# Patient Record
Sex: Female | Born: 1935 | Hispanic: No | Smoking: Never smoker
Health system: Southern US, Community
[De-identification: ages and names within clinical notes are randomized; demographics above are authoritative.]

## PROBLEM LIST (undated history)

## (undated) DIAGNOSIS — K5790 Diverticulosis of intestine, part unspecified, without perforation or abscess without bleeding: Secondary | ICD-10-CM

## (undated) DIAGNOSIS — I509 Heart failure, unspecified: Secondary | ICD-10-CM

## (undated) DIAGNOSIS — I4891 Unspecified atrial fibrillation: Secondary | ICD-10-CM

## (undated) DIAGNOSIS — K5730 Diverticulosis of large intestine without perforation or abscess without bleeding: Secondary | ICD-10-CM

## (undated) DIAGNOSIS — K219 Gastro-esophageal reflux disease without esophagitis: Secondary | ICD-10-CM

## (undated) DIAGNOSIS — M1990 Unspecified osteoarthritis, unspecified site: Secondary | ICD-10-CM

## (undated) DIAGNOSIS — I10 Essential (primary) hypertension: Secondary | ICD-10-CM

## (undated) DIAGNOSIS — R011 Cardiac murmur, unspecified: Secondary | ICD-10-CM

## (undated) DIAGNOSIS — E785 Hyperlipidemia, unspecified: Secondary | ICD-10-CM

## (undated) DIAGNOSIS — R32 Unspecified urinary incontinence: Secondary | ICD-10-CM

## (undated) HISTORY — DX: Hyperlipidemia, unspecified: E78.5

## (undated) HISTORY — DX: Essential (primary) hypertension: I10

## (undated) HISTORY — DX: Unspecified urinary incontinence: R32

## (undated) HISTORY — DX: Diverticulosis of intestine, part unspecified, without perforation or abscess without bleeding: K57.90

## (undated) HISTORY — PX: JOINT REPLACEMENT: SHX530

## (undated) HISTORY — DX: Diverticulosis of large intestine without perforation or abscess without bleeding: K57.30

## (undated) HISTORY — PX: CHOLECYSTECTOMY: SHX55

## (undated) HISTORY — PX: LAMINECTOMY: SHX219

## (undated) HISTORY — DX: Gastro-esophageal reflux disease without esophagitis: K21.9

## (undated) HISTORY — DX: Heart failure, unspecified: I50.9

## (undated) HISTORY — DX: Unspecified osteoarthritis, unspecified site: M19.90

## (undated) HISTORY — PX: SPINAL CORD DECOMPRESSION: SHX97

## (undated) HISTORY — DX: Unspecified atrial fibrillation: I48.91

## (undated) HISTORY — DX: Cardiac murmur, unspecified: R01.1

---

## 2005-12-19 ENCOUNTER — Other Ambulatory Visit: Payer: Self-pay

## 2005-12-19 ENCOUNTER — Inpatient Hospital Stay: Payer: Self-pay | Admitting: Internal Medicine

## 2006-01-01 ENCOUNTER — Ambulatory Visit: Payer: Self-pay | Admitting: Internal Medicine

## 2006-02-10 ENCOUNTER — Observation Stay: Payer: Self-pay | Admitting: Infectious Diseases

## 2006-02-10 ENCOUNTER — Other Ambulatory Visit: Payer: Self-pay

## 2006-07-02 ENCOUNTER — Emergency Department: Payer: Self-pay | Admitting: Emergency Medicine

## 2006-07-02 ENCOUNTER — Other Ambulatory Visit: Payer: Self-pay

## 2006-09-11 ENCOUNTER — Inpatient Hospital Stay: Payer: Self-pay | Admitting: Infectious Diseases

## 2006-09-18 ENCOUNTER — Emergency Department: Payer: Self-pay | Admitting: Emergency Medicine

## 2006-09-18 ENCOUNTER — Other Ambulatory Visit: Payer: Self-pay

## 2007-02-21 ENCOUNTER — Observation Stay: Payer: Self-pay | Admitting: Specialist

## 2007-04-20 ENCOUNTER — Ambulatory Visit: Payer: Self-pay | Admitting: Urology

## 2007-05-02 ENCOUNTER — Ambulatory Visit: Payer: Self-pay | Admitting: Urology

## 2007-10-18 ENCOUNTER — Emergency Department: Payer: Self-pay | Admitting: Internal Medicine

## 2007-10-18 ENCOUNTER — Other Ambulatory Visit: Payer: Self-pay

## 2008-03-07 ENCOUNTER — Ambulatory Visit: Payer: Self-pay | Admitting: Urology

## 2008-03-07 ENCOUNTER — Other Ambulatory Visit: Payer: Self-pay

## 2008-03-12 ENCOUNTER — Ambulatory Visit: Payer: Self-pay | Admitting: Urology

## 2008-10-12 HISTORY — PX: SHOULDER HEMI-ARTHROPLASTY: SHX5049

## 2009-10-20 ENCOUNTER — Inpatient Hospital Stay: Payer: Self-pay | Admitting: Specialist

## 2009-10-25 ENCOUNTER — Encounter: Payer: Self-pay | Admitting: Internal Medicine

## 2009-11-12 ENCOUNTER — Encounter: Payer: Self-pay | Admitting: Internal Medicine

## 2009-11-13 ENCOUNTER — Inpatient Hospital Stay: Payer: Self-pay | Admitting: Internal Medicine

## 2009-12-10 ENCOUNTER — Encounter: Payer: Self-pay | Admitting: Internal Medicine

## 2010-01-10 ENCOUNTER — Encounter: Payer: Self-pay | Admitting: Internal Medicine

## 2010-12-11 ENCOUNTER — Emergency Department: Payer: Self-pay | Admitting: Emergency Medicine

## 2011-02-15 ENCOUNTER — Inpatient Hospital Stay: Payer: Self-pay | Admitting: Internal Medicine

## 2011-02-19 ENCOUNTER — Encounter: Payer: Self-pay | Admitting: Internal Medicine

## 2011-03-13 ENCOUNTER — Encounter: Payer: Self-pay | Admitting: Internal Medicine

## 2011-04-12 ENCOUNTER — Encounter: Payer: Self-pay | Admitting: Internal Medicine

## 2011-05-13 ENCOUNTER — Encounter: Payer: Self-pay | Admitting: Internal Medicine

## 2011-06-13 ENCOUNTER — Encounter: Payer: Self-pay | Admitting: Internal Medicine

## 2011-06-29 ENCOUNTER — Other Ambulatory Visit: Payer: Self-pay | Admitting: Internal Medicine

## 2011-06-30 MED ORDER — POTASSIUM CHLORIDE ER 10 MEQ PO TBCR: 10.0000 meq | EXTENDED_RELEASE_TABLET | Freq: Two times a day (BID) | ORAL | Status: DC

## 2011-07-07 ENCOUNTER — Other Ambulatory Visit: Payer: Self-pay | Admitting: Internal Medicine

## 2011-07-15 ENCOUNTER — Other Ambulatory Visit: Payer: Self-pay | Admitting: Internal Medicine

## 2011-07-15 MED ORDER — OLMESARTAN MEDOXOMIL 40 MG PO TABS: 40.0000 mg | ORAL_TABLET | Freq: Every day | ORAL | Status: DC

## 2011-07-15 MED ORDER — FUROSEMIDE 20 MG PO TABS: 20.0000 mg | ORAL_TABLET | Freq: Every day | ORAL | Status: DC

## 2011-07-17 ENCOUNTER — Other Ambulatory Visit: Payer: Self-pay | Admitting: Internal Medicine

## 2011-07-17 MED ORDER — POTASSIUM CHLORIDE ER 10 MEQ PO TBCR: 10.0000 meq | EXTENDED_RELEASE_TABLET | Freq: Two times a day (BID) | ORAL | Status: DC | PRN

## 2011-07-21 ENCOUNTER — Encounter: Payer: Self-pay | Admitting: Internal Medicine

## 2011-07-24 ENCOUNTER — Ambulatory Visit (INDEPENDENT_AMBULATORY_CARE_PROVIDER_SITE_OTHER): Payer: Medicare Other | Admitting: Internal Medicine

## 2011-07-24 ENCOUNTER — Encounter: Payer: Self-pay | Admitting: Internal Medicine

## 2011-07-24 VITALS — BP 129/67 | HR 88 | Temp 98.1°F | Resp 14

## 2011-07-24 DIAGNOSIS — I4891 Unspecified atrial fibrillation: Secondary | ICD-10-CM

## 2011-07-24 DIAGNOSIS — I5020 Unspecified systolic (congestive) heart failure: Secondary | ICD-10-CM

## 2011-07-24 DIAGNOSIS — I482 Chronic atrial fibrillation, unspecified: Secondary | ICD-10-CM

## 2011-07-24 DIAGNOSIS — I509 Heart failure, unspecified: Secondary | ICD-10-CM

## 2011-07-24 DIAGNOSIS — I10 Essential (primary) hypertension: Secondary | ICD-10-CM

## 2011-07-24 DIAGNOSIS — Z79899 Other long term (current) drug therapy: Secondary | ICD-10-CM

## 2011-07-24 DIAGNOSIS — Z23 Encounter for immunization: Secondary | ICD-10-CM

## 2011-07-24 DIAGNOSIS — I519 Heart disease, unspecified: Secondary | ICD-10-CM

## 2011-07-24 LAB — COMPREHENSIVE METABOLIC PANEL
ALT: 11 U/L (ref 0–35)
AST: 19 U/L (ref 0–37)
Chloride: 102 mEq/L (ref 96–112)
Creatinine, Ser: 1 mg/dL (ref 0.4–1.2)
Total Bilirubin: 0.7 mg/dL (ref 0.3–1.2)

## 2011-07-24 MED ORDER — LOSARTAN POTASSIUM 50 MG PO TABS: 100.0000 mg | ORAL_TABLET | Freq: Every day | ORAL | Status: DC

## 2011-07-24 NOTE — Patient Instructions (Signed)
We will switch from benicar to losartan prior to your next refill.  If the cost is still too high,  Do not accept the bottle,  And call for an alternative 9we''l try linisoril instead and hope that it doesn't cause a cough)  Make sure to moisten bread morsels with water in your mouth before swallowing to avoid choking.

## 2011-07-26 ENCOUNTER — Encounter: Payer: Self-pay | Admitting: Internal Medicine

## 2011-07-26 DIAGNOSIS — I5020 Unspecified systolic (congestive) heart failure: Secondary | ICD-10-CM | POA: Insufficient documentation

## 2011-07-26 DIAGNOSIS — I482 Chronic atrial fibrillation, unspecified: Secondary | ICD-10-CM | POA: Insufficient documentation

## 2011-07-26 DIAGNOSIS — R011 Cardiac murmur, unspecified: Secondary | ICD-10-CM | POA: Insufficient documentation

## 2011-07-26 DIAGNOSIS — I10 Essential (primary) hypertension: Secondary | ICD-10-CM | POA: Insufficient documentation

## 2011-07-26 DIAGNOSIS — K5730 Diverticulosis of large intestine without perforation or abscess without bleeding: Secondary | ICD-10-CM | POA: Insufficient documentation

## 2011-07-26 DIAGNOSIS — E785 Hyperlipidemia, unspecified: Secondary | ICD-10-CM | POA: Insufficient documentation

## 2011-07-26 DIAGNOSIS — M1990 Unspecified osteoarthritis, unspecified site: Secondary | ICD-10-CM | POA: Insufficient documentation

## 2011-07-26 DIAGNOSIS — K219 Gastro-esophageal reflux disease without esophagitis: Secondary | ICD-10-CM | POA: Insufficient documentation

## 2011-07-26 NOTE — Assessment & Plan Note (Signed)
Well compensaated currently with no orthopnea .   Contineu current regimen of diuretics and afterload reducers.

## 2011-07-26 NOTE — Assessment & Plan Note (Signed)
Well-controlled on current regimen. No changes today. 

## 2011-07-26 NOTE — Progress Notes (Signed)
  Subjective:    Patient ID: [name], female    DOB: Mar 08, 1915, 77 y.o.   MRN: [id number]  HPI 77 yo aa female with history of hypertension, congestive heart failure, EF 35-455. Atrial fibrillation , not on coumadin secondary to history of falls,  Brought n by son [name] , her POA for general followup and influenza vaccine. She feels well, has no current or recent pain issues, has a good appetite, and has 24/7 care supported by her grown children..  Does not ambulate much without a walker /wheelchair due to severe OA with prior traumatic fractures and orthopedi surgeries remotely.    Past Medical History  Diagnosis Date  . Incontinence of urine     bladder tac, collagen injections (Dr. Evelene Croon)  . Diverticulosis     with bleeding  . Atrial fibrillation   . Arthritis     secondary to OA and DJD  . Hypertension   . GERD (gastroesophageal reflux disease)   . Heart murmur   . Hyperlipidemia   . CHF (congestive heart failure)     EF 35-35% by y last ECHO  . Diverticulosis of colon    Current Outpatient Prescriptions on File Prior to Visit  Medication Sig Dispense Refill  . furosemide (LASIX) 20 MG tablet Take 1 tablet (20 mg total) by mouth daily.  90 tablet  3  . potassium chloride (K-DUR) 10 MEQ tablet Take 1 tablet (10 mEq total) by mouth 2 (two) times daily as needed. Take as needed on days Lasix is used  60 tablet  3   BP 129/67  Pulse 88  Temp(Src) 98.1 F (36.7 C) (Oral)  Resp 14  SpO2 98%  Review of Systems  Constitutional: Negative for fever, chills and unexpected weight change.  HENT: Negative for hearing loss, ear pain, nosebleeds, congestion, sore throat, facial swelling, rhinorrhea, sneezing, mouth sores, trouble swallowing, neck pain, neck stiffness, voice change, postnasal drip, sinus pressure, tinnitus and ear discharge.   Eyes: Negative for pain, discharge, redness and visual disturbance.  Respiratory: Negative for cough, chest tightness, shortness of breath,  wheezing and stridor.   Cardiovascular: Negative for chest pain, palpitations and leg swelling.  Musculoskeletal: Negative for myalgias and arthralgias.  Skin: Negative for color change and rash.  Neurological: Negative for dizziness, weakness, light-headedness and headaches.  Hematological: Negative for adenopathy.       Objective:   Physical Exam  Constitutional: She is oriented to person, place, and time. She appears well-developed and well-nourished.  HENT:  Mouth/Throat: Oropharynx is clear and moist.  Eyes: EOM are normal. Pupils are equal, round, and reactive to light. No scleral icterus.  Neck: Normal range of motion. Neck supple. No JVD present. No thyromegaly present.  Cardiovascular: Normal rate, regular rhythm, normal heart sounds and intact distal pulses.   Pulmonary/Chest: Effort normal and breath sounds normal.  Abdominal: Soft. Bowel sounds are normal. She exhibits no mass. There is no tenderness.  Musculoskeletal: Normal range of motion. She exhibits no edema.  Lymphadenopathy:    She has no cervical adenopathy.  Neurological: She is alert and oriented to person, place, and time.  Skin: Skin is warm and dry.  Psychiatric: She has a normal mood and affect.          Assessment & Plan:

## 2011-07-26 NOTE — Assessment & Plan Note (Signed)
Currently rate controlled .  No anticoagulation due to history of diverticular bleed.

## 2011-08-10 ENCOUNTER — Encounter: Payer: Self-pay | Admitting: Internal Medicine

## 2011-08-10 ENCOUNTER — Ambulatory Visit (INDEPENDENT_AMBULATORY_CARE_PROVIDER_SITE_OTHER): Payer: Medicare Other | Admitting: Internal Medicine

## 2011-08-10 VITALS — BP 124/75 | HR 87 | Temp 97.5°F | Resp 16

## 2011-08-10 DIAGNOSIS — J069 Acute upper respiratory infection, unspecified: Secondary | ICD-10-CM

## 2011-08-10 NOTE — Patient Instructions (Signed)
Your cough appears viral right now.  Continue allegra for drainage,  And add Delsym cough syrup for daytime cough.Rip Harbour to use benzonatate 200 mg at bedtime for nighttime cough.  Give 1 hr before bed.  If the cough or the sinus drainage develops a color other than yellow,  Call and we will send an antibiotic  to   the pharmacy.    Try Vicks vapo rub on the chest for congestion.    Check the humidity in the room to see if it's too dry

## 2011-08-10 NOTE — Progress Notes (Signed)
Subjective:    Patient ID: [name], female    DOB: [birth year], 77 y.o.   MRN: [id number]  HPI  77 yo white female presents with cough x 2 days, coryza, throat itching.  Her family called the [place] office on Sunday and switched to the answering service.  The Triage nurse did not  Call her back but the answering service played phone tag.  No fevers, purulent sputum or sinus pain. Coughed all last night because family could not reach anyone to determine appropriate medications.   \ Past Medical History  Diagnosis Date  . Incontinence of urine     bladder tac, collagen injections (Dr. Evelene Croon)  . Diverticulosis     with bleeding  . Atrial fibrillation   . Arthritis     secondary to OA and DJD  . Hypertension   . GERD (gastroesophageal reflux disease)   . Heart murmur   . Hyperlipidemia   . CHF (congestive heart failure)     EF 35-35% by y last ECHO  . Diverticulosis of colon    Current Outpatient Prescriptions on File Prior to Visit  Medication Sig Dispense Refill  . Calcium Carbonate-Vitamin D (CALCIUM + D) 600-200 MG-UNIT TABS Take by mouth.        . digoxin (LANOXIN) 0.125 MG tablet Take 125 mcg by mouth daily.        Marland Kitchen diltiazem (CARDIZEM CD) 240 MG 24 hr capsule Take 240 mg by mouth daily.        . ferrous sulfate 325 (65 FE) MG EC tablet Take 325 mg by mouth 3 (three) times daily with meals.        . furosemide (LASIX) 20 MG tablet Take 1 tablet (20 mg total) by mouth daily.  90 tablet  3  . losartan (COZAAR) 50 MG tablet Take 2 tablets (100 mg total) by mouth daily.  30 tablet  11  . potassium chloride (K-DUR) 10 MEQ tablet Take 1 tablet (10 mEq total) by mouth 2 (two) times daily as needed. Take as needed on days Lasix is used  60 tablet  3     Review of Systems  Constitutional: Negative for fever, chills and unexpected weight change.  HENT: Positive for congestion, rhinorrhea and postnasal drip. Negative for hearing loss, ear pain, nosebleeds, sore throat, facial  swelling, sneezing, mouth sores, trouble swallowing, neck pain, neck stiffness, voice change, sinus pressure, tinnitus and ear discharge.   Eyes: Negative for pain, discharge, redness and visual disturbance.  Respiratory: Positive for cough. Negative for chest tightness, shortness of breath, wheezing and stridor.   Cardiovascular: Negative for chest pain, palpitations and leg swelling.  Musculoskeletal: Negative for myalgias and arthralgias.  Skin: Negative for color change and rash.  Neurological: Negative for dizziness, weakness, light-headedness and headaches.  Hematological: Negative for adenopathy.       Objective:   Physical Exam  Constitutional: She is oriented to person, place, and time. She appears well-developed and well-nourished.  HENT:  Mouth/Throat: Oropharynx is clear and moist.  Eyes: EOM are normal. Pupils are equal, round, and reactive to light. No scleral icterus.  Neck: Normal range of motion. Neck supple. No JVD present. No thyromegaly present.  Cardiovascular: Normal rate, regular rhythm, normal heart sounds and intact distal pulses.   Pulmonary/Chest: Effort normal and breath sounds normal.  Abdominal: Soft. Bowel sounds are normal. She exhibits no mass. There is no tenderness.  Musculoskeletal: Normal range of motion. She exhibits no edema.  Lymphadenopathy:  She has no cervical adenopathy.  Neurological: She is alert and oriented to person, place, and time.  Skin: Skin is warm and dry.  Psychiatric: She has a normal mood and affect.       Assessment & Plan:  URI:  Her exam is consistent with a viral URI.  Will treat cough with Delsym for daytime adn tessalon for evening

## 2011-08-20 ENCOUNTER — Encounter: Payer: Self-pay | Admitting: Internal Medicine

## 2011-08-24 ENCOUNTER — Other Ambulatory Visit: Payer: Self-pay | Admitting: Internal Medicine

## 2011-08-25 ENCOUNTER — Other Ambulatory Visit: Payer: Self-pay | Admitting: Internal Medicine

## 2011-08-25 MED ORDER — DILTIAZEM HCL ER COATED BEADS 240 MG PO CP24: 240.0000 mg | ORAL_CAPSULE | Freq: Every day | ORAL | Status: DC

## 2011-10-08 ENCOUNTER — Telehealth: Payer: Self-pay | Admitting: Internal Medicine

## 2011-10-08 MED ORDER — DIGOXIN 125 MCG PO TABS: 125.0000 ug | ORAL_TABLET | Freq: Every day | ORAL | Status: DC

## 2011-10-08 NOTE — Telephone Encounter (Signed)
He also has a question if there is a generic prescription for the benicar can you please call that one in.

## 2011-10-08 NOTE — Telephone Encounter (Signed)
Patient was changed from Benicar to losartan at her last visit due to cost. Called and advised her son that the losartan should be at the pharmacy. Refilled digoxin.

## 2011-11-05 ENCOUNTER — Telehealth: Payer: Self-pay | Admitting: Internal Medicine

## 2011-11-05 NOTE — Telephone Encounter (Signed)
Patient is out of her Ferrous Sulfate 325 mg and Klor-con 10 mg . She is needing a prior- authorization on both.

## 2011-11-06 MED ORDER — FERROUS SULFATE 325 (65 FE) MG PO TBEC: 325.0000 mg | DELAYED_RELEASE_TABLET | Freq: Three times a day (TID) | ORAL | Status: DC

## 2011-11-06 MED ORDER — POTASSIUM CHLORIDE ER 10 MEQ PO TBCR: 10.0000 meq | EXTENDED_RELEASE_TABLET | Freq: Two times a day (BID) | ORAL | Status: DC | PRN

## 2011-12-09 ENCOUNTER — Telehealth: Payer: Self-pay | Admitting: Internal Medicine

## 2011-12-09 NOTE — Telephone Encounter (Signed)
[phone] vondra called to get order for ms [name] for [facility]  adacia @[facility]  is the person vondra spoke with

## 2011-12-10 NOTE — Telephone Encounter (Signed)
I have the referral ready to fax to [facility].

## 2011-12-10 NOTE — Telephone Encounter (Signed)
I talked to [name] and she stated she talked to you about if patient agreed to participate in PT you would set up.  [name] has agreed on PT and Vondra asked that we set her up with [facility].  Is this referral ok?

## 2011-12-10 NOTE — Telephone Encounter (Signed)
Absolutely.  Patient is homebound /wheelchair bound mostly and does not drive, so home PT appropriate

## 2011-12-18 ENCOUNTER — Telehealth: Payer: Self-pay | Admitting: Internal Medicine

## 2011-12-18 NOTE — Telephone Encounter (Signed)
[phone] Huntley Dec @ [facility] called [facility] sent them a referral on ms [place].  [facility] could not help pt at this time and they faxed info to [facility].  Huntley Dec needs additional information

## 2011-12-18 NOTE — Telephone Encounter (Signed)
I spoke with Huntley Dec and she asked that we fax over notes to [phone], she states all that [facility] sent over was a face sheet.  I have faxed information to Huntley Dec.

## 2012-01-11 ENCOUNTER — Ambulatory Visit: Payer: Medicare Other

## 2012-01-12 ENCOUNTER — Ambulatory Visit (INDEPENDENT_AMBULATORY_CARE_PROVIDER_SITE_OTHER): Payer: Medicare Other | Admitting: *Deleted

## 2012-01-12 DIAGNOSIS — Z111 Encounter for screening for respiratory tuberculosis: Secondary | ICD-10-CM

## 2012-01-14 ENCOUNTER — Other Ambulatory Visit: Payer: Self-pay | Admitting: Internal Medicine

## 2012-01-14 LAB — TB SKIN TEST
Induration: 3
TB Skin Test: NEGATIVE mm

## 2012-01-14 MED ORDER — CALCIUM CARBONATE-VITAMIN D 600-200 MG-UNIT PO TABS: 1.0000 | ORAL_TABLET | Freq: Two times a day (BID) | ORAL | Status: DC

## 2012-01-20 ENCOUNTER — Telehealth: Payer: Self-pay | Admitting: Internal Medicine

## 2012-01-20 NOTE — Telephone Encounter (Signed)
[place] called requesting a Plan of care and verbal order for PT services.

## 2012-01-21 NOTE — Telephone Encounter (Signed)
I tried calling [place] back but no one would answer, I will try again.

## 2012-01-22 NOTE — Telephone Encounter (Signed)
I talked to [place] she is faxing over the PT paperwork that needs to be filled out.

## 2012-02-11 ENCOUNTER — Telehealth: Payer: Self-pay | Admitting: Internal Medicine

## 2012-02-11 NOTE — Telephone Encounter (Signed)
Jim notified, chart updated.

## 2012-02-11 NOTE — Telephone Encounter (Signed)
The potassium is to be given every day the the furosemide  is given, so if they are giving fursomdie daily.  Give the potassium daily. The calcium is 1 tablet twice daily with meals  600 mg.

## 2012-02-11 NOTE — Telephone Encounter (Signed)
Jim from [facility] called and stated since patient is now in assisted living some medication orders need to be clarified.  He stated on the FL-2 form the calcium did not specify a strength, and the potassium was prn but how is the staff to know when it is to be given to the patient.  Please advise.

## 2012-03-01 ENCOUNTER — Other Ambulatory Visit: Payer: Self-pay | Admitting: Internal Medicine

## 2012-03-01 ENCOUNTER — Ambulatory Visit (INDEPENDENT_AMBULATORY_CARE_PROVIDER_SITE_OTHER): Payer: Medicare Other | Admitting: Internal Medicine

## 2012-03-01 ENCOUNTER — Encounter: Payer: Self-pay | Admitting: Internal Medicine

## 2012-03-01 ENCOUNTER — Telehealth: Payer: Self-pay | Admitting: Internal Medicine

## 2012-03-01 VITALS — BP 106/64 | HR 95 | Temp 98.5°F | Resp 16

## 2012-03-01 DIAGNOSIS — J069 Acute upper respiratory infection, unspecified: Secondary | ICD-10-CM

## 2012-03-01 MED ORDER — AZITHROMYCIN 500 MG PO TABS: 500.0000 mg | ORAL_TABLET | Freq: Every day | ORAL | Status: DC

## 2012-03-01 MED ORDER — DEXTROMETHORPHAN POLISTIREX 30 MG/5ML PO LQCR: 60.0000 mg | Freq: Two times a day (BID) | ORAL | Status: DC

## 2012-03-01 MED ORDER — AZITHROMYCIN 500 MG PO TABS: 500.0000 mg | ORAL_TABLET | Freq: Every day | ORAL | Status: AC

## 2012-03-01 MED ORDER — DEXTROMETHORPHAN POLISTIREX 30 MG/5ML PO LQCR: 60.0000 mg | Freq: Two times a day (BID) | ORAL | Status: AC | PRN

## 2012-03-01 MED ORDER — SALINE 0.9 % NA AERS: 1.0000 | INHALATION_SPRAY | Freq: Two times a day (BID) | NASAL | Status: DC

## 2012-03-01 MED ORDER — FEXOFENADINE HCL 180 MG PO TABS: 180.0000 mg | ORAL_TABLET | Freq: Every day | ORAL | Status: DC

## 2012-03-01 NOTE — Patient Instructions (Addendum)
Your symptoms are from allergic rhinitis,  Which has led to an upper respiratory infection.   Please take generic Allegra once daily for the sneezing,   and flush your  sinuses twice daily with Simply Saline to help the post nasal drip that is causing the cough     Use Delsym 2 or 3 times daily to quiet the cough.  Azithromycin antibiotic for 7 days to prevent infection

## 2012-03-01 NOTE — Telephone Encounter (Signed)
Caller: Marda Stalker; PCP: Duncan Dull; CB#: [phone]; Call regarding [name] starting with a  Cough/Congestion onset 02/28/12. Daughter/[name] requesting appnt to have her checked-Spoke with Elmo Putt at [facility] and she verified that she has occasional dry Cough that kept her up last night. Afebrile.  Emergent sx r/o per Cough protocol and appnt Scheduled @ 0945 03/01/12 with Dr. Darrick Huntsman.

## 2012-03-01 NOTE — Progress Notes (Signed)
Patient ID: Lauretta Chester, female   DOB: 03-14-1915, 78 y.o.   MRN: [id number]  Patient Active Problem List  Diagnoses  . Arthritis  . Hypertension  . GERD (gastroesophageal reflux disease)  . Heart murmur  . Hyperlipidemia  . Diverticulosis of colon  . Chronic atrial fibrillation  . Congestive heart failure with left ventricular systolic dysfunction  . URI (upper respiratory infection)    Subjective:  CC:   Chief Complaint  Patient presents with  . Cough    since Friday    HPI:   Ryanne Jonesis a 78 y.o. female who presents with cough, sneezing since Sunday.  Cough is dry,  Feels too weak to get on potty chair.  Appetite reported by patient to be ok,  But per family did not eat well.  Denies diarrhea nausea and vomiting.  o fevers.      Past Medical History  Diagnosis Date  . Incontinence of urine     bladder tac, collagen injections (Dr. Evelene Croon)  . Diverticulosis     with bleeding  . Atrial fibrillation   . Arthritis     secondary to OA and DJD  . Hypertension   . GERD (gastroesophageal reflux disease)   . Heart murmur   . Hyperlipidemia   . CHF (congestive heart failure)     EF 35-35% by y last ECHO  . Diverticulosis of colon     Past Surgical History  Procedure Date  . Cholecystectomy   . Laminectomy   . Joint replacement     bilateral knee  . Cesarean section   . Shoulder hemi-arthroplasty 2010    right shoulder intramedullar rod and screw  . Spinal cord decompression     laminectomy         The following portions of the patient's history were reviewed and updated as appropriate: Allergies, current medications, and problem list.    Review of Systems:   12 Pt  review of systems was negative except those addressed in the HPI,     History   Social History  . Marital Status: Widowed    Spouse Name: N/A    Number of Children: N/A  . Years of Education: N/A   Occupational History  . Not on file.   Social History Main Topics  .  Smoking status: Never Smoker   . Smokeless tobacco: Never Used  . Alcohol Use: No  . Drug Use: No  . Sexually Active: Not on file   Other Topics Concern  . Not on file   Social History Narrative  . No narrative on file    Objective:  BP 106/64  Pulse 95  Temp(Src) 98.5 F (36.9 C) (Oral)  Resp 16  SpO2 95%  General appearance: alert, cooperative and appears stated age Ears: normal TM's and external ear canals both ears Throat: lips, mucosa, and tongue normal; teeth and gums normal Neck: no adenopathy, no carotid bruit, supple, symmetrical, trachea midline and thyroid not enlarged, symmetric, no tenderness/mass/nodules Back: symmetric, no curvature. ROM normal. No CVA tenderness. Lungs: clear to auscultation bilaterally Heart: regular rate and rhythm, S1, S2 normal, no murmur, click, rub or gallop Abdomen: soft, non-tender; bowel sounds normal; no masses,  no organomegaly Pulses: 2+ and symmetric Skin: Skin color, texture, turgor normal. No rashes or lesions Lymph nodes: Cervical, supraclavicular, and axillary nodes normal.  Assessment and Plan:  URI (upper respiratory infection) Symptoms may have started as allergic rhinitis but have caused malaise and she is in a group  facility. She is not wheezing and has no sign of pneumonia. Empiric tx with abx.    Updated Medication List Outpatient Encounter Prescriptions as of 03/01/2012  Medication Sig Dispense Refill  . Calcium Carbonate-Vitamin D (CALCIUM + D) 600-200 MG-UNIT TABS Take 1 tablet by mouth 2 (two) times daily.  60 tablet  6  . digoxin (LANOXIN) 0.125 MG tablet Take 1 tablet (125 mcg total) by mouth daily.  30 tablet  2  . diltiazem (CARDIZEM CD) 240 MG 24 hr capsule Take 1 capsule (240 mg total) by mouth daily.  30 capsule  7  . ferrous sulfate 325 (65 FE) MG EC tablet Take 1 tablet (325 mg total) by mouth 3 (three) times daily with meals.  90 tablet  5  . fexofenadine (ALLEGRA) 180 MG tablet Take 1 tablet (180 mg  total) by mouth daily.  30 tablet  1  . furosemide (LASIX) 20 MG tablet Take 1 tablet (20 mg total) by mouth daily.  90 tablet  3  . losartan (COZAAR) 50 MG tablet Take 2 tablets (100 mg total) by mouth daily.  30 tablet  11  . potassium chloride (K-DUR) 10 MEQ tablet Take 10 mEq by mouth daily.      . Saline (SIMPLY SALINE) 0.9 % AERS Place 1 spray into the nose 2 (two) times daily.  44 mL  1  . DISCONTD: azithromycin (ZITHROMAX) 500 MG tablet Take 1 tablet (500 mg total) by mouth daily.  5 tablet  0  . DISCONTD: dextromethorphan (DELSYM) 30 MG/5ML liquid Take 10 mLs (60 mg total) by mouth 2 (two) times daily.  148 mL  0     No orders of the defined types were placed in this encounter.    No Follow-up on file.

## 2012-03-02 DIAGNOSIS — J069 Acute upper respiratory infection, unspecified: Secondary | ICD-10-CM | POA: Insufficient documentation

## 2012-03-02 NOTE — Assessment & Plan Note (Addendum)
Symptoms may have started as allergic rhinitis but have caused malaise and she is in a group facility. She is not wheezing and has no sign of pneumonia. Empiric tx with abx.

## 2012-03-17 ENCOUNTER — Ambulatory Visit: Payer: Medicare Other

## 2012-03-23 ENCOUNTER — Ambulatory Visit (INDEPENDENT_AMBULATORY_CARE_PROVIDER_SITE_OTHER): Payer: Medicare Other | Admitting: Internal Medicine

## 2012-03-23 DIAGNOSIS — Z111 Encounter for screening for respiratory tuberculosis: Secondary | ICD-10-CM

## 2012-03-25 LAB — TB SKIN TEST
Induration: 0 mm
TB Skin Test: NEGATIVE

## 2012-03-31 ENCOUNTER — Telehealth: Payer: Self-pay | Admitting: Internal Medicine

## 2012-03-31 MED ORDER — ALPRAZOLAM 0.25 MG PO TABS: 0.2500 mg | ORAL_TABLET | Freq: Two times a day (BID) | ORAL | Status: AC | PRN

## 2012-03-31 NOTE — Telephone Encounter (Signed)
Caller: Rhea Bleacher [facility]; PCP: Duncan Dull; CB#: [phone];  Call regarding Re: The patient has been have trouble sleeping and agitation/ panic issues; onset 03/26/12, afraid to transfer/waliking. VSS: 130/72, 82, 18, T 97.3. Denies compaints other than being weak, this is constant. Symptoms reviewed Agitation Guideline,  caller requests RX for this patient, last OV 03/22/12. Please call.

## 2012-03-31 NOTE — Telephone Encounter (Signed)
Rx has been sent  

## 2012-03-31 NOTE — Telephone Encounter (Signed)
Please send rx for alprazolam to facility I will print out.

## 2012-04-06 ENCOUNTER — Ambulatory Visit (INDEPENDENT_AMBULATORY_CARE_PROVIDER_SITE_OTHER): Payer: Medicare Other | Admitting: Internal Medicine

## 2012-04-06 ENCOUNTER — Encounter: Payer: Self-pay | Admitting: Internal Medicine

## 2012-04-06 VITALS — BP 124/56 | HR 67 | Temp 98.1°F | Resp 14

## 2012-04-06 DIAGNOSIS — Z79899 Other long term (current) drug therapy: Secondary | ICD-10-CM

## 2012-04-06 DIAGNOSIS — N390 Urinary tract infection, site not specified: Secondary | ICD-10-CM

## 2012-04-06 DIAGNOSIS — R5383 Other fatigue: Secondary | ICD-10-CM

## 2012-04-06 DIAGNOSIS — K5900 Constipation, unspecified: Secondary | ICD-10-CM

## 2012-04-06 DIAGNOSIS — R05 Cough: Secondary | ICD-10-CM

## 2012-04-06 DIAGNOSIS — R110 Nausea: Secondary | ICD-10-CM

## 2012-04-06 LAB — POCT URINALYSIS DIPSTICK
Glucose, UA: NEGATIVE
Nitrite, UA: POSITIVE
Spec Grav, UA: 1.015
Urobilinogen, UA: 0.2

## 2012-04-06 MED ORDER — POLYETHYLENE GLYCOL 3350 17 GM/SCOOP PO POWD: ORAL | Status: DC

## 2012-04-06 MED ORDER — OMEPRAZOLE 40 MG PO CPDR: 40.0000 mg | DELAYED_RELEASE_CAPSULE | Freq: Every day | ORAL | Status: DC

## 2012-04-06 MED ORDER — CIPROFLOXACIN HCL 250 MG PO TABS: 250.0000 mg | ORAL_TABLET | Freq: Two times a day (BID) | ORAL | Status: AC

## 2012-04-06 NOTE — Patient Instructions (Addendum)
You have a urinary tract infection.  Take cirpfoloxacin twice daily for 5 days   Stop the iron immediately  Start miralax fiber laxative daily for 1 week, then as needed for constipation   If nausea is not relieved in one week , start daily omeprazole 40 mg

## 2012-04-06 NOTE — Progress Notes (Signed)
Patient ID: [name], female   DOB: [birth year], 78 y.o.   MRN: [id number] Patient Active Problem List  Diagnosis  . Arthritis  . Hypertension  . GERD (gastroesophageal reflux disease)  . Heart murmur  . Hyperlipidemia  . Diverticulosis of colon  . Chronic atrial fibrillation  . Congestive heart failure with left ventricular systolic dysfunction  . URI (upper respiratory infection)  . Cough  . Lethargy    Subjective:  CC:   Chief Complaint  Patient presents with  . Not Feeling Well    HPI:   Jennifer Jonesis a 78 y.o. female who presents with a 1 to 2 week history of malaise, dizziness, and lethargy.  She reports feeling sleepy most of the time.  She has early morning wakening at 2 am frequently.  She awakes with a feeling of a mucous ball in the back of her throat which results in coughing, followed by panic about choking She is able to drink water at the time.  No fevers, chills, chest pain or shortness of breath .  No orthopnea.  Had a minor fall 2 days ago, which occurred while using walker to return to her room after having lunch in the dining area,  Fall was witnessed,  No LOC, no reports of bruising, hip or shoulder/arm pain.    Past Medical History  Diagnosis Date  . Incontinence of urine     bladder tac, collagen injections (Dr. Evelene Croon)  . Diverticulosis     with bleeding  . Atrial fibrillation   . Arthritis     secondary to OA and DJD  . Hypertension   . GERD (gastroesophageal reflux disease)   . Heart murmur   . Hyperlipidemia   . CHF (congestive heart failure)     EF 35-35% by y last ECHO  . Diverticulosis of colon     Past Surgical History  Procedure Date  . Cholecystectomy   . Laminectomy   . Joint replacement     bilateral knee  . Cesarean section   . Shoulder hemi-arthroplasty 2010    right shoulder intramedullar rod and screw  . Spinal cord decompression     laminectomy         The following portions of the patient's history were  reviewed and updated as appropriate: Allergies, current medications, and problem list.    Review of Systems:  Lethargy, constipation and cough.  Remainder of comprehesive  review of systems was negative except those addressed in the HPI,     History   Social History  . Marital Status: Widowed    Spouse Name: N/A    Number of Children: N/A  . Years of Education: N/A   Occupational History  . Not on file.   Social History Main Topics  . Smoking status: Never Smoker   . Smokeless tobacco: Never Used  . Alcohol Use: No  . Drug Use: No  . Sexually Active: Not on file   Other Topics Concern  . Not on file   Social History Narrative  . No narrative on file    Objective:  BP 124/56  Pulse 67  Temp 98.1 F (36.7 C) (Oral)  Resp 14  SpO2 96%  General appearance: alert, cooperative and appears stated age Ears: normal TM's and external ear canals both ears Throat: lips, mucosa, and tongue normal; teeth and gums normal Neck: no adenopathy, no carotid bruit, supple, symmetrical, trachea midline and thyroid not enlarged, symmetric, no tenderness/mass/nodules Back: symmetric, no curvature. ROM normal.  No CVA tenderness. Lungs: clear to auscultation bilaterally Heart: regular rate and rhythm, S1, S2 normal, no murmur, click, rub or gallop Abdomen: soft, non-tender; bowel sounds normal; no masses,  no organomegaly Pulses: 2+ and symmetric Skin: Skin color, texture, turgor normal. No rashes or lesions Lymph nodes: Cervical, supraclavicular, and axillary nodes normal.  Assessment and Plan:  Cough Exam is normal.  Suggest treating for PND with saline sinus lavage twice daily and a daily non sedating anthistamine like allegra.   Lethargy Presumed secondary to age and UTI.  Will treat empirically with cipro given sulfa allergy.   Constipation secondaty to use of iron.,  Supplement was stopped today.  miralax was prescribed for 7 days,  Then prn.    Updated Medication  List Outpatient Encounter Prescriptions as of 04/06/2012  Medication Sig Dispense Refill  . ALPRAZolam (XANAX) 0.25 MG tablet Take 1 tablet (0.25 mg total) by mouth 2 (two) times daily as needed for sleep or anxiety.  60 tablet  3  . Calcium Carbonate-Vitamin D (CALCIUM + D) 600-200 MG-UNIT TABS Take 1 tablet by mouth 2 (two) times daily.  60 tablet  6  . digoxin (LANOXIN) 0.125 MG tablet Take 1 tablet (125 mcg total) by mouth daily.  30 tablet  2  . diltiazem (CARDIZEM CD) 240 MG 24 hr capsule Take 1 capsule (240 mg total) by mouth daily.  30 capsule  7  . ferrous sulfate 325 (65 FE) MG EC tablet Take 1 tablet (325 mg total) by mouth 3 (three) times daily with meals.  90 tablet  5  . furosemide (LASIX) 20 MG tablet Take 1 tablet (20 mg total) by mouth daily.  90 tablet  3  . losartan (COZAAR) 50 MG tablet Take 2 tablets (100 mg total) by mouth daily.  30 tablet  11  . potassium chloride (K-DUR) 10 MEQ tablet Take 10 mEq by mouth daily.      . ciprofloxacin (CIPRO) 250 MG tablet Take 1 tablet (250 mg total) by mouth 2 (two) times daily.  10 tablet  0  . omeprazole (PRILOSEC) 40 MG capsule Take 1 capsule (40 mg total) by mouth daily.  30 capsule  3  . polyethylene glycol powder (GLYCOLAX/MIRALAX) powder 17 gm inn 8 ounces of water daiily for one week, then as needed for constipation  3350 g  1  . DISCONTD: fexofenadine (ALLEGRA) 180 MG tablet Take 1 tablet (180 mg total) by mouth daily.  30 tablet  1  . DISCONTD: omeprazole (PRILOSEC) 40 MG capsule Take 1 capsule (40 mg total) by mouth daily.  30 capsule  3  . DISCONTD: polyethylene glycol powder (GLYCOLAX/MIRALAX) powder 17 gm inn 8 ounces of water daiily for one week, then as needed for constipation  3350 g  1  . DISCONTD: Saline (SIMPLY SALINE) 0.9 % AERS Place 1 spray into the nose 2 (two) times daily.  44 mL  1

## 2012-04-07 LAB — COMPLETE METABOLIC PANEL WITH GFR
AST: 16 U/L (ref 0–37)
Alkaline Phosphatase: 54 U/L (ref 39–117)
GFR, Est Non African American: 61 mL/min
Glucose, Bld: 100 mg/dL — ABNORMAL HIGH (ref 70–99)
Sodium: 139 mEq/L (ref 135–145)
Total Bilirubin: 0.7 mg/dL (ref 0.3–1.2)
Total Protein: 7.2 g/dL (ref 6.0–8.3)

## 2012-04-09 ENCOUNTER — Encounter: Payer: Self-pay | Admitting: Internal Medicine

## 2012-04-09 DIAGNOSIS — K5900 Constipation, unspecified: Secondary | ICD-10-CM | POA: Insufficient documentation

## 2012-04-09 DIAGNOSIS — R5383 Other fatigue: Secondary | ICD-10-CM | POA: Insufficient documentation

## 2012-04-09 DIAGNOSIS — R05 Cough: Secondary | ICD-10-CM | POA: Insufficient documentation

## 2012-04-09 LAB — URINE CULTURE: Colony Count: >=100000

## 2012-04-09 NOTE — Assessment & Plan Note (Signed)
Exam is normal.  Suggest treating for PND with saline sinus lavage twice daily and a daily non sedating anthistamine like allegra.

## 2012-04-09 NOTE — Assessment & Plan Note (Signed)
secondaty to use of iron.,  Supplement was stopped today.  miralax was prescribed for 7 days,  Then prn.

## 2012-04-09 NOTE — Assessment & Plan Note (Signed)
Presumed secondary to age and UTI.  Will treat empirically with cipro given sulfa allergy.

## 2012-04-12 ENCOUNTER — Emergency Department: Payer: Self-pay | Admitting: Unknown Physician Specialty

## 2012-04-12 LAB — CBC WITH DIFFERENTIAL/PLATELET
Basophil %: 0.6 %
Eosinophil #: 0.1 10*3/uL (ref 0.0–0.7)
Eosinophil %: 0.5 %
HGB: 13.6 g/dL (ref 12.0–16.0)
Lymphocyte #: 1.1 10*3/uL (ref 1.0–3.6)
MCH: 32 pg (ref 26.0–34.0)
MCHC: 34 g/dL (ref 32.0–36.0)
MCV: 94 fL (ref 80–100)
Monocyte #: 0.9 x10 3/mm (ref 0.2–0.9)
Monocyte %: 9.8 %
Neutrophil #: 7.2 10*3/uL — ABNORMAL HIGH (ref 1.4–6.5)
Neutrophil %: 77.5 %
Platelet: 207 10*3/uL (ref 150–440)
RBC: 4.24 10*6/uL (ref 3.80–5.20)
RDW: 13.4 % (ref 11.5–14.5)

## 2012-04-12 LAB — URINALYSIS, COMPLETE
Bilirubin,UR: NEGATIVE
Glucose,UR: NEGATIVE mg/dL (ref 0–75)
Ketone: NEGATIVE
Ph: 5 (ref 4.5–8.0)
Squamous Epithelial: 2

## 2012-04-12 LAB — BASIC METABOLIC PANEL
BUN: 14 mg/dL (ref 7–18)
Chloride: 102 mmol/L (ref 98–107)
Co2: 30 mmol/L (ref 21–32)
Creatinine: 1.11 mg/dL (ref 0.60–1.30)
EGFR (Non-African Amer.): 42 — ABNORMAL LOW
Glucose: 122 mg/dL — ABNORMAL HIGH (ref 65–99)
Osmolality: 276 (ref 275–301)
Potassium: 4.6 mmol/L (ref 3.5–5.1)
Sodium: 137 mmol/L (ref 136–145)

## 2012-04-12 LAB — HEPATIC FUNCTION PANEL A (ARMC)
Albumin: 4.1 g/dL (ref 3.4–5.0)
Alkaline Phosphatase: 88 U/L (ref 50–136)
SGOT(AST): 94 U/L — ABNORMAL HIGH (ref 15–37)
SGPT (ALT): 49 U/L

## 2012-05-18 ENCOUNTER — Other Ambulatory Visit (INDEPENDENT_AMBULATORY_CARE_PROVIDER_SITE_OTHER): Payer: Medicare Other | Admitting: *Deleted

## 2012-05-18 DIAGNOSIS — Z79899 Other long term (current) drug therapy: Secondary | ICD-10-CM

## 2012-05-18 DIAGNOSIS — Z5181 Encounter for therapeutic drug level monitoring: Secondary | ICD-10-CM

## 2012-05-18 LAB — CBC WITH DIFFERENTIAL/PLATELET
Basophils Relative: 0.5 % (ref 0.0–3.0)
HCT: 39.7 % (ref 36.0–46.0)
Hemoglobin: 13.4 g/dL (ref 12.0–15.0)
Lymphocytes Relative: 17.1 % (ref 12.0–46.0)
Lymphs Abs: 1.4 10*3/uL (ref 0.7–4.0)
MCHC: 33.7 g/dL (ref 30.0–36.0)
Monocytes Relative: 9.4 % (ref 3.0–12.0)
Neutro Abs: 5.7 10*3/uL (ref 1.4–7.7)
RBC: 4.3 Mil/uL (ref 3.87–5.11)
RDW: 13 % (ref 11.5–14.6)

## 2012-05-18 LAB — COMPREHENSIVE METABOLIC PANEL
ALT: 7 U/L (ref 0–35)
AST: 17 U/L (ref 0–37)
BUN: 13 mg/dL (ref 6–23)
Calcium: 9.4 mg/dL (ref 8.4–10.5)
Chloride: 96 mEq/L (ref 96–112)
Creatinine, Ser: 1.1 mg/dL (ref 0.4–1.2)
GFR: 51.8 mL/min — ABNORMAL LOW (ref >60.00)
Total Bilirubin: 1 mg/dL (ref 0.3–1.2)

## 2012-05-31 ENCOUNTER — Encounter: Payer: Self-pay | Admitting: Internal Medicine

## 2012-06-15 ENCOUNTER — Other Ambulatory Visit: Payer: Self-pay | Admitting: *Deleted

## 2012-06-15 DIAGNOSIS — I10 Essential (primary) hypertension: Secondary | ICD-10-CM

## 2012-06-15 MED ORDER — LOSARTAN POTASSIUM 50 MG PO TABS: 100.0000 mg | ORAL_TABLET | Freq: Every day | ORAL | Status: DC

## 2012-07-05 ENCOUNTER — Other Ambulatory Visit: Payer: Self-pay | Admitting: *Deleted

## 2012-07-05 DIAGNOSIS — R110 Nausea: Secondary | ICD-10-CM

## 2012-07-05 MED ORDER — CALCIUM CARBONATE-VITAMIN D 600-200 MG-UNIT PO TABS: 1.0000 | ORAL_TABLET | Freq: Two times a day (BID) | ORAL | Status: DC

## 2012-07-05 MED ORDER — OMEPRAZOLE 40 MG PO CPDR: 40.0000 mg | DELAYED_RELEASE_CAPSULE | Freq: Every day | ORAL | Status: DC

## 2012-08-08 ENCOUNTER — Ambulatory Visit (INDEPENDENT_AMBULATORY_CARE_PROVIDER_SITE_OTHER): Payer: Medicare Other | Admitting: Internal Medicine

## 2012-08-08 DIAGNOSIS — Z23 Encounter for immunization: Secondary | ICD-10-CM

## 2012-08-15 ENCOUNTER — Ambulatory Visit: Payer: Medicare Other | Admitting: Internal Medicine

## 2012-08-16 ENCOUNTER — Ambulatory Visit: Payer: Medicare Other | Admitting: Internal Medicine

## 2012-09-23 ENCOUNTER — Other Ambulatory Visit: Payer: Self-pay

## 2012-09-23 MED ORDER — POTASSIUM CHLORIDE ER 10 MEQ PO TBCR: 10.0000 meq | EXTENDED_RELEASE_TABLET | Freq: Every day | ORAL | Status: DC

## 2012-09-23 NOTE — Telephone Encounter (Signed)
Refill request from [facility] for Potassium 10 MEQ # 30 [phone] R sent electronic.

## 2012-10-26 ENCOUNTER — Emergency Department: Payer: Self-pay | Admitting: Emergency Medicine

## 2012-10-26 LAB — CBC
HGB: 13.4 g/dL (ref 12.0–16.0)
MCH: 31.1 pg (ref 26.0–34.0)
MCV: 92 fL (ref 80–100)
Platelet: 237 10*3/uL (ref 150–440)
WBC: 7.1 10*3/uL (ref 3.6–11.0)

## 2012-10-26 LAB — BASIC METABOLIC PANEL
Calcium, Total: 9.5 mg/dL (ref 8.5–10.1)
Co2: 29 mmol/L (ref 21–32)
Creatinine: 0.94 mg/dL (ref 0.60–1.30)
EGFR (Non-African Amer.): 52 — ABNORMAL LOW
Glucose: 100 mg/dL — ABNORMAL HIGH (ref 65–99)
Sodium: 140 mmol/L (ref 136–145)

## 2012-10-26 LAB — URINALYSIS, COMPLETE
Ketone: NEGATIVE
Leukocyte Esterase: NEGATIVE
Protein: NEGATIVE
RBC,UR: 4 /HPF (ref 0–5)
Specific Gravity: 1.005 (ref 1.003–1.030)
Squamous Epithelial: 1

## 2012-10-31 ENCOUNTER — Other Ambulatory Visit: Payer: Self-pay | Admitting: Internal Medicine

## 2012-10-31 MED ORDER — POTASSIUM CHLORIDE ER 10 MEQ PO TBCR: 10.0000 meq | EXTENDED_RELEASE_TABLET | Freq: Every day | ORAL | Status: DC

## 2012-10-31 MED ORDER — FUROSEMIDE 20 MG PO TABS: 20.0000 mg | ORAL_TABLET | Freq: Every day | ORAL | Status: DC

## 2012-10-31 NOTE — Telephone Encounter (Signed)
Med filled.  

## 2012-10-31 NOTE — Telephone Encounter (Signed)
furosemide (LASIX) 20 MG tablet   #30  potassium chloride (K-DUR) 10 MEQ tablet   #30

## 2012-11-29 ENCOUNTER — Other Ambulatory Visit: Payer: Self-pay | Admitting: *Deleted

## 2012-12-01 MED ORDER — POTASSIUM CHLORIDE ER 10 MEQ PO TBCR: 10.0000 meq | EXTENDED_RELEASE_TABLET | Freq: Every day | ORAL | Status: DC

## 2013-01-04 ENCOUNTER — Other Ambulatory Visit: Payer: Self-pay | Admitting: *Deleted

## 2013-01-04 MED ORDER — POTASSIUM CHLORIDE ER 10 MEQ PO TBCR: 10.0000 meq | EXTENDED_RELEASE_TABLET | Freq: Every day | ORAL | Status: DC

## 2013-01-04 NOTE — Telephone Encounter (Signed)
Med filled.  

## 2013-01-19 ENCOUNTER — Other Ambulatory Visit: Payer: Self-pay | Admitting: *Deleted

## 2013-01-19 DIAGNOSIS — I10 Essential (primary) hypertension: Secondary | ICD-10-CM

## 2013-01-19 MED ORDER — LOSARTAN POTASSIUM 50 MG PO TABS: 100.0000 mg | ORAL_TABLET | Freq: Every day | ORAL | Status: DC

## 2013-01-19 NOTE — Telephone Encounter (Signed)
Med filled.  

## 2013-01-24 ENCOUNTER — Other Ambulatory Visit: Payer: Self-pay | Admitting: *Deleted

## 2013-01-24 NOTE — Telephone Encounter (Signed)
Pt not seen since 03/2012 ok to fill?

## 2013-01-25 MED ORDER — CALCIUM CARBONATE-VITAMIN D 600-200 MG-UNIT PO TABS: 1.0000 | ORAL_TABLET | Freq: Two times a day (BID) | ORAL | Status: DC

## 2013-01-25 NOTE — Telephone Encounter (Signed)
Ok to refill,  Authorized in epic 

## 2013-02-01 ENCOUNTER — Other Ambulatory Visit: Payer: Self-pay | Admitting: *Deleted

## 2013-02-01 DIAGNOSIS — R110 Nausea: Secondary | ICD-10-CM

## 2013-02-01 MED ORDER — OMEPRAZOLE 40 MG PO CPDR: 40.0000 mg | DELAYED_RELEASE_CAPSULE | Freq: Every day | ORAL | Status: DC

## 2013-02-01 MED ORDER — POTASSIUM CHLORIDE ER 10 MEQ PO TBCR: 10.0000 meq | EXTENDED_RELEASE_TABLET | Freq: Every day | ORAL | Status: DC

## 2013-02-01 NOTE — Telephone Encounter (Signed)
Rx sent to pharmacy by escript Appt scheduled 02/10/13

## 2013-02-10 ENCOUNTER — Ambulatory Visit (INDEPENDENT_AMBULATORY_CARE_PROVIDER_SITE_OTHER): Payer: Medicare Other | Admitting: Internal Medicine

## 2013-02-10 ENCOUNTER — Encounter: Payer: Self-pay | Admitting: Internal Medicine

## 2013-02-10 VITALS — BP 112/54 | HR 89 | Temp 97.8°F | Resp 16

## 2013-02-10 DIAGNOSIS — I482 Chronic atrial fibrillation, unspecified: Secondary | ICD-10-CM

## 2013-02-10 DIAGNOSIS — I4891 Unspecified atrial fibrillation: Secondary | ICD-10-CM

## 2013-02-10 DIAGNOSIS — I10 Essential (primary) hypertension: Secondary | ICD-10-CM

## 2013-02-10 DIAGNOSIS — E785 Hyperlipidemia, unspecified: Secondary | ICD-10-CM

## 2013-02-10 DIAGNOSIS — Z79899 Other long term (current) drug therapy: Secondary | ICD-10-CM

## 2013-02-10 DIAGNOSIS — E559 Vitamin D deficiency, unspecified: Secondary | ICD-10-CM

## 2013-02-10 DIAGNOSIS — D649 Anemia, unspecified: Secondary | ICD-10-CM

## 2013-02-10 DIAGNOSIS — K5900 Constipation, unspecified: Secondary | ICD-10-CM

## 2013-02-10 DIAGNOSIS — Z0000 Encounter for general adult medical examination without abnormal findings: Secondary | ICD-10-CM

## 2013-02-10 LAB — CBC WITH DIFFERENTIAL/PLATELET
Basophils Absolute: 0.1 10*3/uL (ref 0.0–0.1)
Eosinophils Relative: 2 % (ref 0–5)
Lymphocytes Relative: 24 % (ref 12–46)
MCV: 88.7 fL (ref 78.0–100.0)
Neutrophils Relative %: 65 % (ref 43–77)
Platelets: 271 10*3/uL (ref 150–400)
RDW: 14.1 % (ref 11.5–15.5)
WBC: 7.4 10*3/uL (ref 4.0–10.5)

## 2013-02-10 NOTE — Patient Instructions (Addendum)
[name] is doing extremely well  I will call [name] with the results of her bloodwork today   Follow up in one year, sooner if needed    No changes to medications today

## 2013-02-10 NOTE — Progress Notes (Signed)
Patient ID: [name], female   DOB: Jun 30, 1915, 79 y.o.   MRN: [id number]   The patient is here for annual Medicare wellness examination and management of other chronic and acute problems.   The risk factors are reflected in the social history.  She is happy and well cared for in the [facility] facility. And accompanied by her daughter today.   The roster of all physicians providing medical care to patient - is listed in the Snapshot section of the chart.  Activities of daily living:  The patient is not 100% independent in all ADLs: she required assistance  Showering, transferring and ambulating.   Home safety : The patient's residence has smoke detectors.  She does not drive There are no firearms at home. There is no violence in the home.   There is no risks for hepatitis, STDs or HIV. There is no   history of blood transfusion. They have no travel history to infectious disease endemic areas of the world.  The patient has seen their dentist in the last six month. They have seen their eye doctor in the last year. They admit to slight hearing difficulty with regard to whispered voices and some television programs.  They have deferred audiologic testing in the last year.  They do not  have excessive sun exposure. Discussed the need for sun protection: hats, long sleeves and use of sunscreen if there is significant sun exposure.   Diet: the importance of a healthy diet is discussed. They do have a healthy diet.  The benefits of regular aerobic exercise were discussed. Shehas OA and DJD  Of the knees which limits her mobility.  Her weight has been stable per facility. .   Depression screen: there are no signs or vegative symptoms of depression- irritability, change in appetite, anhedonia, sadness/tearfullness.  Cognitive assessment: the patient's son and daughter  manage her financial and personal affairs /  She  could relate day, and month only , she recalled 203 objects at 3 minutes; performed  clock-face test normally.  The following portions of the patient's history were reviewed and updated as appropriate: allergies, current medications, past family history, past medical history,  past surgical history, past social history  and problem list.  Visual acuity was not assessed per patient preference since she has regular follow up with her ophthalmologist. Hearing and body mass index were assessed and reviewed.   During the course of the visit the patient was educated and counseled about appropriate screening and preventive services including : fall prevention , diabetes screening, nutrition counseling, colorectal cancer screening, and recommended immunizations.  She has deferred cancer screening due to her advanced age.   Objective  BP 112/54  Pulse 89  Temp(Src) 97.8 F (36.6 C) (Oral)  Resp 16  SpO2 98%  .General appearance: alert, cooperative and appears stated age Ears: normal TM's and external ear canals both ears Throat: lips, mucosa, and tongue normal; teeth and gums normal Neck: no adenopathy, no carotid bruit, supple, symmetrical, trachea midline and thyroid not enlarged, symmetric, no tenderness/mass/nodules Back: symmetric, no curvature. ROM normal. No CVA tenderness. Lungs: clear to auscultation bilaterally Heart: regular rate and rhythm, S1, S2 normal, Grade 3/6 systolic murmur, no click, rub or gallop Abdomen: soft, non-tender; bowel sounds normal; no masses,  no organomegaly Pulses: 2+ and symmetric Skin: Skin color, texture, turgor normal. No rashes or lesions Lymph nodes: Cervical, supraclavicular, and axillary nodes normal.  Assessment and Plan  Unspecified vitamin D deficiency Secondary to sedentary lifestyle.  Will start weekly Drisdol indefinitely.   Chronic atrial fibrillation Well controlled on current regimen.  disocin lovele is normal, no changes today.  Hypertension Well controlled on current regimen. Renal function stable, no changes  today.  Hyperlipidemia Given her extreme age, statin therapy was discontinued at age 37.  Constipation Secondary to ferrous sulfate ,  Which was discontinued last year. Continue daily miralax.   Updated Medication List Outpatient Encounter Prescriptions as of 02/10/2013  Medication Sig Dispense Refill  . Calcium Carbonate-Vitamin D (CALCIUM + D) 600-200 MG-UNIT TABS Take 1 tablet by mouth 2 (two) times daily.  60 tablet  6  . digoxin (LANOXIN) 0.125 MG tablet Take 1 tablet (125 mcg total) by mouth daily.  30 tablet  2  . diltiazem (CARDIZEM CD) 240 MG 24 hr capsule Take 1 capsule (240 mg total) by mouth daily.  30 capsule  7  . furosemide (LASIX) 20 MG tablet Take 1 tablet (20 mg total) by mouth daily.  90 tablet  3  . losartan (COZAAR) 50 MG tablet Take 2 tablets (100 mg total) by mouth daily.  30 tablet  5  . omeprazole (PRILOSEC) 40 MG capsule Take 1 capsule (40 mg total) by mouth daily.  30 capsule  0  . potassium chloride (K-DUR) 10 MEQ tablet Take 1 tablet (10 mEq total) by mouth daily.  30 tablet  0  . ergocalciferol (DRISDOL) 50000 UNITS capsule Take 1 capsule (50,000 Units total) by mouth once a week.  4 capsule  2  . polyethylene glycol powder (GLYCOLAX/MIRALAX) powder 17 gm inn 8 ounces of water daiily for one week, then as needed for constipation  3350 g  1  . [DISCONTINUED] ferrous sulfate 325 (65 FE) MG EC tablet Take 1 tablet (325 mg total) by mouth 3 (three) times daily with meals.  90 tablet  5   No facility-administered encounter medications on file as of 02/10/2013.

## 2013-02-11 DIAGNOSIS — E559 Vitamin D deficiency, unspecified: Secondary | ICD-10-CM | POA: Insufficient documentation

## 2013-02-11 LAB — TSH: TSH: 1.382 u[IU]/mL (ref 0.350–4.500)

## 2013-02-11 LAB — COMPREHENSIVE METABOLIC PANEL
Albumin: 4.4 g/dL (ref 3.5–5.2)
CO2: 26 mEq/L (ref 19–32)
Calcium: 9.2 mg/dL (ref 8.4–10.5)
Glucose, Bld: 121 mg/dL — ABNORMAL HIGH (ref 70–99)
Potassium: 4 mEq/L (ref 3.5–5.3)
Sodium: 139 mEq/L (ref 135–145)
Total Protein: 7.3 g/dL (ref 6.0–8.3)

## 2013-02-11 LAB — VITAMIN D 25 HYDROXY (VIT D DEFICIENCY, FRACTURES): Vit D, 25-Hydroxy: 25 ng/mL — ABNORMAL LOW (ref 30–89)

## 2013-02-11 MED ORDER — ERGOCALCIFEROL 1.25 MG (50000 UT) PO CAPS: 50000.0000 [IU] | ORAL_CAPSULE | ORAL | Status: DC

## 2013-02-11 NOTE — Assessment & Plan Note (Addendum)
Secondary to sedentary lifestyle.  Will start weekly Drisdol indefinitely.

## 2013-02-11 NOTE — Assessment & Plan Note (Signed)
Well controlled on current regimen.  disocin lovele is normal, no changes today.

## 2013-02-11 NOTE — Assessment & Plan Note (Signed)
Secondary to ferrous sulfate ,  Which was discontinued last year. Continue daily miralax.

## 2013-02-11 NOTE — Assessment & Plan Note (Signed)
Well controlled on current regimen. Renal function stable, no changes today. 

## 2013-02-11 NOTE — Assessment & Plan Note (Signed)
Given her extreme age, statin therapy was discontinued at age 79.

## 2013-03-07 ENCOUNTER — Other Ambulatory Visit: Payer: Self-pay | Admitting: *Deleted

## 2013-03-07 DIAGNOSIS — R110 Nausea: Secondary | ICD-10-CM

## 2013-03-08 MED ORDER — DIGOXIN 125 MCG PO TABS: 125.0000 ug | ORAL_TABLET | Freq: Every day | ORAL | Status: DC

## 2013-03-08 MED ORDER — OMEPRAZOLE 40 MG PO CPDR: 40.0000 mg | DELAYED_RELEASE_CAPSULE | Freq: Every day | ORAL | Status: DC

## 2013-03-08 MED ORDER — POTASSIUM CHLORIDE ER 10 MEQ PO TBCR: 10.0000 meq | EXTENDED_RELEASE_TABLET | Freq: Every day | ORAL | Status: DC

## 2013-03-17 ENCOUNTER — Other Ambulatory Visit: Payer: Self-pay | Admitting: *Deleted

## 2013-03-17 DIAGNOSIS — E559 Vitamin D deficiency, unspecified: Secondary | ICD-10-CM

## 2013-03-23 MED ORDER — ERGOCALCIFEROL 1.25 MG (50000 UT) PO CAPS: 50000.0000 [IU] | ORAL_CAPSULE | ORAL | Status: DC

## 2013-03-23 NOTE — Telephone Encounter (Signed)
Yes,  rx refilled, She will need a repeat Vit D level in 3 months

## 2013-03-23 NOTE — Telephone Encounter (Signed)
Do you want her to continue this after this month's prescription is completed?

## 2013-04-04 ENCOUNTER — Emergency Department: Payer: Self-pay | Admitting: Emergency Medicine

## 2013-04-04 LAB — COMPREHENSIVE METABOLIC PANEL
Albumin: 4.3 g/dL (ref 3.4–5.0)
Alkaline Phosphatase: 94 U/L (ref 50–136)
Anion Gap: 7 (ref 7–16)
BUN: 14 mg/dL (ref 7–18)
Bilirubin,Total: 0.7 mg/dL (ref 0.2–1.0)
Creatinine: 0.84 mg/dL (ref 0.60–1.30)
Glucose: 109 mg/dL — ABNORMAL HIGH (ref 65–99)
SGOT(AST): 26 U/L (ref 15–37)
SGPT (ALT): 16 U/L (ref 12–78)
Total Protein: 8.2 g/dL (ref 6.4–8.2)

## 2013-04-04 LAB — LIPASE, BLOOD: Lipase: 87 U/L (ref 73–393)

## 2013-04-04 LAB — URINALYSIS, COMPLETE
Bacteria: NONE SEEN
Bilirubin,UR: NEGATIVE
Ketone: NEGATIVE
Leukocyte Esterase: NEGATIVE
Nitrite: NEGATIVE
Ph: 7 (ref 4.5–8.0)
Protein: NEGATIVE
RBC,UR: 3 /HPF (ref 0–5)
WBC UR: NONE SEEN /HPF (ref 0–5)

## 2013-04-04 LAB — CBC
HCT: 40.8 % (ref 35.0–47.0)
HGB: 14.2 g/dL (ref 12.0–16.0)
MCH: 31.3 pg (ref 26.0–34.0)
MCV: 90 fL (ref 80–100)
Platelet: 244 10*3/uL (ref 150–440)
RDW: 13.5 % (ref 11.5–14.5)

## 2013-04-04 LAB — TROPONIN I: Troponin-I: <0.02 ng/mL

## 2013-04-04 LAB — DIGOXIN LEVEL: Digoxin: 1.98 ng/mL

## 2013-04-04 LAB — PROTIME-INR: INR: 1

## 2013-04-10 ENCOUNTER — Emergency Department: Payer: Self-pay | Admitting: Emergency Medicine

## 2013-04-10 LAB — URINALYSIS, COMPLETE
Ketone: NEGATIVE
Nitrite: NEGATIVE
Ph: 6 (ref 4.5–8.0)
Protein: NEGATIVE
Squamous Epithelial: 1
WBC UR: 1 /HPF (ref 0–5)

## 2013-04-10 LAB — COMPREHENSIVE METABOLIC PANEL
Albumin: 3.8 g/dL (ref 3.4–5.0)
BUN: 14 mg/dL (ref 7–18)
Chloride: 105 mmol/L (ref 98–107)
Co2: 30 mmol/L (ref 21–32)
Creatinine: 0.98 mg/dL (ref 0.60–1.30)
Glucose: 104 mg/dL — ABNORMAL HIGH (ref 65–99)
Osmolality: 280 (ref 275–301)
SGOT(AST): 17 U/L (ref 15–37)
SGPT (ALT): 16 U/L (ref 12–78)
Sodium: 140 mmol/L (ref 136–145)

## 2013-04-10 LAB — CBC
MCV: 91 fL (ref 80–100)
Platelet: 222 10*3/uL (ref 150–440)
WBC: 8.5 10*3/uL (ref 3.6–11.0)

## 2013-05-03 ENCOUNTER — Ambulatory Visit (INDEPENDENT_AMBULATORY_CARE_PROVIDER_SITE_OTHER): Payer: Medicare Other | Admitting: Internal Medicine

## 2013-05-03 ENCOUNTER — Encounter: Payer: Self-pay | Admitting: Internal Medicine

## 2013-05-03 VITALS — BP 118/66 | HR 79 | Temp 98.1°F | Resp 14

## 2013-05-03 DIAGNOSIS — M25551 Pain in right hip: Secondary | ICD-10-CM

## 2013-05-03 DIAGNOSIS — N9489 Other specified conditions associated with female genital organs and menstrual cycle: Secondary | ICD-10-CM

## 2013-05-03 DIAGNOSIS — M25559 Pain in unspecified hip: Secondary | ICD-10-CM

## 2013-05-03 DIAGNOSIS — G8929 Other chronic pain: Secondary | ICD-10-CM

## 2013-05-03 MED ORDER — HYDROCODONE-ACETAMINOPHEN 5-325 MG PO TABS: 1.0000 | ORAL_TABLET | Freq: Every evening | ORAL | Status: DC | PRN
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
## 2013-05-03 NOTE — Patient Instructions (Addendum)
Vicodin 1 tlabet at bedtime,  May repeat x 1  Physical Therapy to help with mobility and pain control

## 2013-05-05 ENCOUNTER — Encounter: Payer: Self-pay | Admitting: Internal Medicine

## 2013-05-05 DIAGNOSIS — N9489 Other specified conditions associated with female genital organs and menstrual cycle: Secondary | ICD-10-CM | POA: Insufficient documentation

## 2013-05-05 DIAGNOSIS — M25551 Pain in right hip: Secondary | ICD-10-CM | POA: Insufficient documentation

## 2013-05-05 NOTE — Assessment & Plan Note (Signed)
A 2.3 cm right adnexal mass was discussed privately with patient's daughter who has accompanied her today from the rest home. She has requested that we not discuss this with patient. Patient is not a candidate for surgery at the age of 41 and discussing this would only increase her anxiety.

## 2013-05-05 NOTE — Assessment & Plan Note (Signed)
With no recent fall, and prior history of fracture which healed without surgical intervention, her current pain is more than likely due to severe arthritis. She does not want to undergo orthopedic evaluation or even x-rays at this time. Trial of Vicodin and physical therapy for mobility. If her pain cannot be resolved with physical therapy and use of one to 2 Vicodin daily we will recommend referral to orthopedics for further consideration as she may have DJD which would respond to an intra-articular injection of steroids

## 2013-05-09 ENCOUNTER — Telehealth: Payer: Self-pay | Admitting: Internal Medicine

## 2013-05-09 NOTE — Telephone Encounter (Signed)
Mrs. [name] called wanting to know if Home Health orders for physical therapy were done for her mother's legs and back.Marland Kitchen

## 2013-05-11 NOTE — Telephone Encounter (Signed)
Faxed order to [facility] for PT previously faxed to [facility] whom could not take patient due to insurance. Notified patient daughter that as soon as we hear something I will be glad to call her back.

## 2013-06-07 ENCOUNTER — Telehealth: Payer: Self-pay | Admitting: *Deleted

## 2013-06-07 DIAGNOSIS — E559 Vitamin D deficiency, unspecified: Secondary | ICD-10-CM

## 2013-06-07 NOTE — Telephone Encounter (Signed)
Refill Request  Vitamin D 1.25 mg cap  #4  Take 1 capsule by mouth once weekly for supplement

## 2013-06-08 MED ORDER — ERGOCALCIFEROL 1.25 MG (50000 UT) PO CAPS: 50000.0000 [IU] | ORAL_CAPSULE | ORAL | Status: DC

## 2013-06-08 NOTE — Telephone Encounter (Signed)
Refill sent.

## 2013-06-25 ENCOUNTER — Emergency Department: Payer: Self-pay | Admitting: Internal Medicine

## 2013-06-25 LAB — COMPREHENSIVE METABOLIC PANEL
Albumin: 3.8 g/dL (ref 3.4–5.0)
Anion Gap: 4 — ABNORMAL LOW (ref 7–16)
BUN: 13 mg/dL (ref 7–18)
Chloride: 101 mmol/L (ref 98–107)
Co2: 30 mmol/L (ref 21–32)
EGFR (Non-African Amer.): 55 — ABNORMAL LOW
Glucose: 113 mg/dL — ABNORMAL HIGH (ref 65–99)
Osmolality: 271 (ref 275–301)
Potassium: 3.9 mmol/L (ref 3.5–5.1)
Sodium: 135 mmol/L — ABNORMAL LOW (ref 136–145)
Total Protein: 7.4 g/dL (ref 6.4–8.2)

## 2013-06-25 LAB — CBC
HCT: 40.8 % (ref 35.0–47.0)
HGB: 13.7 g/dL (ref 12.0–16.0)
MCHC: 33.5 g/dL (ref 32.0–36.0)
MCV: 92 fL (ref 80–100)
RBC: 4.43 10*6/uL (ref 3.80–5.20)
RDW: 13.3 % (ref 11.5–14.5)

## 2013-06-25 LAB — URINALYSIS, COMPLETE
Bilirubin,UR: NEGATIVE
Glucose,UR: NEGATIVE mg/dL (ref 0–75)
Hyaline Cast: 6
Ketone: NEGATIVE
Specific Gravity: 1.01 (ref 1.003–1.030)
Squamous Epithelial: 1

## 2013-06-25 LAB — LIPASE, BLOOD: Lipase: 93 U/L (ref 73–393)

## 2013-06-29 ENCOUNTER — Ambulatory Visit (INDEPENDENT_AMBULATORY_CARE_PROVIDER_SITE_OTHER): Payer: Medicare Other

## 2013-06-29 DIAGNOSIS — Z23 Encounter for immunization: Secondary | ICD-10-CM

## 2013-08-08 ENCOUNTER — Other Ambulatory Visit: Payer: Self-pay | Admitting: *Deleted

## 2013-08-08 DIAGNOSIS — I10 Essential (primary) hypertension: Secondary | ICD-10-CM

## 2013-08-08 MED ORDER — DILTIAZEM HCL ER COATED BEADS 240 MG PO CP24: 240.0000 mg | ORAL_CAPSULE | Freq: Every day | ORAL | Status: DC

## 2013-08-08 MED ORDER — POTASSIUM CHLORIDE ER 10 MEQ PO TBCR: 10.0000 meq | EXTENDED_RELEASE_TABLET | Freq: Every day | ORAL | Status: DC

## 2013-08-08 MED ORDER — LOSARTAN POTASSIUM 50 MG PO TABS: 100.0000 mg | ORAL_TABLET | Freq: Every day | ORAL | Status: DC

## 2013-08-19 ENCOUNTER — Inpatient Hospital Stay: Payer: Self-pay | Admitting: Student

## 2013-08-19 LAB — URINALYSIS, COMPLETE
Bilirubin,UR: NEGATIVE
Glucose,UR: NEGATIVE mg/dL (ref 0–75)
Ketone: NEGATIVE
Ph: 7 (ref 4.5–8.0)
Protein: NEGATIVE
RBC,UR: 1 /HPF (ref 0–5)
Squamous Epithelial: 2
WBC UR: 17 /HPF (ref 0–5)

## 2013-08-19 LAB — BASIC METABOLIC PANEL
Anion Gap: 4 — ABNORMAL LOW (ref 7–16)
Creatinine: 0.88 mg/dL (ref 0.60–1.30)
EGFR (Non-African Amer.): 56 — ABNORMAL LOW
Glucose: 113 mg/dL — ABNORMAL HIGH (ref 65–99)
Potassium: 4.2 mmol/L (ref 3.5–5.1)

## 2013-08-19 LAB — CBC
HCT: 40.1 % (ref 35.0–47.0)
MCH: 31 pg (ref 26.0–34.0)
MCHC: 34.4 g/dL (ref 32.0–36.0)
Platelet: 221 10*3/uL (ref 150–440)
RBC: 4.44 10*6/uL (ref 3.80–5.20)
RDW: 13.3 % (ref 11.5–14.5)

## 2013-08-19 LAB — DIGOXIN LEVEL: Digoxin: 1.53 ng/mL

## 2013-08-20 LAB — CBC WITH DIFFERENTIAL/PLATELET
Basophil #: 0.1 10*3/uL (ref 0.0–0.1)
Eosinophil #: 0.1 10*3/uL (ref 0.0–0.7)
HCT: 35.1 % (ref 35.0–47.0)
HGB: 12.2 g/dL (ref 12.0–16.0)
Lymphocyte %: 17.8 %
MCH: 31.5 pg (ref 26.0–34.0)
MCHC: 34.8 g/dL (ref 32.0–36.0)
MCV: 90 fL (ref 80–100)
Neutrophil #: 5.5 10*3/uL (ref 1.4–6.5)
Neutrophil %: 71.1 %
Platelet: 194 10*3/uL (ref 150–440)
RDW: 13.4 % (ref 11.5–14.5)
WBC: 7.7 10*3/uL (ref 3.6–11.0)

## 2013-08-20 LAB — BASIC METABOLIC PANEL
Anion Gap: 3 — ABNORMAL LOW (ref 7–16)
BUN: 13 mg/dL (ref 7–18)
Calcium, Total: 9 mg/dL (ref 8.5–10.1)
EGFR (Non-African Amer.): >60
Osmolality: 272 (ref 275–301)

## 2013-08-21 LAB — CBC WITH DIFFERENTIAL/PLATELET
Basophil #: 0.1 10*3/uL (ref 0.0–0.1)
Basophil %: 1.2 %
Eosinophil #: 0.2 10*3/uL (ref 0.0–0.7)
Lymphocyte #: 1.7 10*3/uL (ref 1.0–3.6)
Lymphocyte %: 27.7 %
MCV: 91 fL (ref 80–100)
Monocyte #: 0.7 x10 3/mm (ref 0.2–0.9)
Monocyte %: 11 %
Neutrophil #: 3.6 10*3/uL (ref 1.4–6.5)
Neutrophil %: 57.7 %
RBC: 4.11 10*6/uL (ref 3.80–5.20)
WBC: 6.3 10*3/uL (ref 3.6–11.0)

## 2013-08-21 LAB — URINE CULTURE

## 2013-08-24 ENCOUNTER — Telehealth: Payer: Self-pay | Admitting: Internal Medicine

## 2013-08-24 NOTE — Telephone Encounter (Signed)
The patient has a hospital follow up on 11.21.14. [facility] will fax notes.

## 2013-08-25 NOTE — Telephone Encounter (Signed)
FYI

## 2013-08-25 NOTE — Telephone Encounter (Signed)
Reached daughter's voicemail left message to return call for hospital follow up.

## 2013-08-25 NOTE — Telephone Encounter (Signed)
Please call patient and follow the protocol for hospital followup.  How is patient doing? any medications changed

## 2013-08-29 ENCOUNTER — Other Ambulatory Visit: Payer: Self-pay | Admitting: *Deleted

## 2013-08-29 MED ORDER — CALCIUM CARBONATE-VITAMIN D 600-200 MG-UNIT PO TABS: 1.0000 | ORAL_TABLET | Freq: Two times a day (BID) | ORAL | Status: DC

## 2013-08-29 NOTE — Telephone Encounter (Signed)
Refill Request  Vitamin D 1.25 mg cap #4  Take one capsule by mouth once weekly for supplement

## 2013-09-01 ENCOUNTER — Ambulatory Visit: Payer: Medicare Other | Admitting: Internal Medicine

## 2013-09-05 DIAGNOSIS — IMO0001 Reserved for inherently not codable concepts without codable children: Secondary | ICD-10-CM

## 2013-09-05 DIAGNOSIS — Z4789 Encounter for other orthopedic aftercare: Secondary | ICD-10-CM

## 2013-09-05 DIAGNOSIS — I509 Heart failure, unspecified: Secondary | ICD-10-CM

## 2013-09-05 DIAGNOSIS — I10 Essential (primary) hypertension: Secondary | ICD-10-CM

## 2013-09-08 ENCOUNTER — Other Ambulatory Visit: Payer: Self-pay | Admitting: *Deleted

## 2013-09-08 DIAGNOSIS — R110 Nausea: Secondary | ICD-10-CM

## 2013-09-08 MED ORDER — OMEPRAZOLE 40 MG PO CPDR: 40.0000 mg | DELAYED_RELEASE_CAPSULE | Freq: Every day | ORAL | Status: DC

## 2013-09-13 ENCOUNTER — Ambulatory Visit: Payer: Self-pay | Admitting: Pain Medicine

## 2013-09-19 ENCOUNTER — Other Ambulatory Visit: Payer: Self-pay | Admitting: *Deleted

## 2013-09-19 MED ORDER — DIGOXIN 125 MCG PO TABS: 125.0000 ug | ORAL_TABLET | Freq: Every day | ORAL | Status: DC

## 2013-09-28 ENCOUNTER — Other Ambulatory Visit: Payer: Self-pay | Admitting: *Deleted

## 2013-09-28 MED ORDER — FUROSEMIDE 20 MG PO TABS: 20.0000 mg | ORAL_TABLET | Freq: Every day | ORAL | Status: DC

## 2013-10-11 ENCOUNTER — Other Ambulatory Visit: Payer: Self-pay | Admitting: *Deleted

## 2013-10-11 ENCOUNTER — Telehealth: Payer: Self-pay | Admitting: *Deleted

## 2013-10-11 MED ORDER — DIGOXIN 125 MCG PO TABS: 125.0000 ug | ORAL_TABLET | Freq: Every day | ORAL | Status: DC

## 2013-10-11 NOTE — Telephone Encounter (Signed)
Refill Request  Digoxin 0.125 mg   Take 1 tablet by mouth each morning for heart

## 2013-10-11 NOTE — Telephone Encounter (Signed)
Medication not on current list of meds, ok to refill?

## 2013-10-11 NOTE — Telephone Encounter (Signed)
Yes i have refilled it

## 2013-10-30 ENCOUNTER — Telehealth: Payer: Self-pay | Admitting: *Deleted

## 2013-10-30 DIAGNOSIS — E559 Vitamin D deficiency, unspecified: Secondary | ICD-10-CM

## 2013-10-30 NOTE — Telephone Encounter (Signed)
Pharmacy Note:  Please provide refills if you want patient to continue this therapy, or d/c order for the home's use when complete  Vitamin D 1.25 mg cap  #4   Take one capsule by mouth once weekly for supplement

## 2013-10-31 MED ORDER — ERGOCALCIFEROL 1.25 MG (50000 UT) PO CAPS: 50000.0000 [IU] | ORAL_CAPSULE | ORAL | Status: DC

## 2013-10-31 NOTE — Telephone Encounter (Signed)
Please advise if you would like to refill? 

## 2013-10-31 NOTE — Telephone Encounter (Signed)
Yes 3 more months  rx filled

## 2013-11-02 NOTE — Telephone Encounter (Signed)
Refill faxed as requested. 

## 2013-11-29 ENCOUNTER — Telehealth: Payer: Self-pay | Admitting: Internal Medicine

## 2013-11-29 NOTE — Telephone Encounter (Signed)
The patient is tired will not eat feels like she is sinking . The patient's daughter called needing an appointment as soon as possible. Please call Gypsy DecantJeanette Hayes at  [phone].

## 2013-11-29 NOTE — Telephone Encounter (Signed)
You have a 15 min spot on Friday that is on hold please advise.

## 2013-11-29 NOTE — Telephone Encounter (Signed)
Friday is fine

## 2013-11-29 NOTE — Telephone Encounter (Signed)
Please schedule patient for 2.30 spot Friday per Darrick Huntsmanullo

## 2013-11-30 ENCOUNTER — Encounter: Payer: Self-pay | Admitting: Internal Medicine

## 2013-11-30 ENCOUNTER — Ambulatory Visit (INDEPENDENT_AMBULATORY_CARE_PROVIDER_SITE_OTHER): Payer: Commercial Managed Care - HMO | Admitting: Internal Medicine

## 2013-11-30 ENCOUNTER — Ambulatory Visit: Payer: Medicare Other | Admitting: Adult Health

## 2013-11-30 VITALS — BP 100/60 | HR 75 | Temp 97.3°F | Resp 16 | Wt 110.5 lb

## 2013-11-30 DIAGNOSIS — R5383 Other fatigue: Principal | ICD-10-CM

## 2013-11-30 DIAGNOSIS — R5381 Other malaise: Secondary | ICD-10-CM

## 2013-11-30 DIAGNOSIS — E538 Deficiency of other specified B group vitamins: Secondary | ICD-10-CM

## 2013-11-30 DIAGNOSIS — R634 Abnormal weight loss: Secondary | ICD-10-CM

## 2013-11-30 DIAGNOSIS — R627 Adult failure to thrive: Secondary | ICD-10-CM

## 2013-11-30 DIAGNOSIS — Z79899 Other long term (current) drug therapy: Secondary | ICD-10-CM

## 2013-11-30 DIAGNOSIS — E559 Vitamin D deficiency, unspecified: Secondary | ICD-10-CM

## 2013-11-30 LAB — COMPLETE METABOLIC PANEL WITH GFR
ALT: 8 U/L (ref 0–35)
AST: 12 U/L (ref 0–37)
Albumin: 4.7 g/dL (ref 3.5–5.2)
Alkaline Phosphatase: 61 U/L (ref 39–117)
BUN: 17 mg/dL (ref 6–23)
CALCIUM: 9.5 mg/dL (ref 8.4–10.5)
CHLORIDE: 97 meq/L (ref 96–112)
CO2: 31 mEq/L (ref 19–32)
Creat: 0.83 mg/dL (ref 0.50–1.10)
GFR, Est African American: 69 mL/min
GFR, Est Non African American: 60 mL/min
Glucose, Bld: 102 mg/dL — ABNORMAL HIGH (ref 70–99)
Potassium: 4.7 mEq/L (ref 3.5–5.3)
Sodium: 138 mEq/L (ref 135–145)
Total Bilirubin: 0.7 mg/dL (ref 0.2–1.2)
Total Protein: 7.2 g/dL (ref 6.0–8.3)

## 2013-11-30 LAB — CBC WITH DIFFERENTIAL/PLATELET
BASOS ABS: 0 10*3/uL (ref 0.0–0.1)
Basophils Relative: 0.3 % (ref 0.0–3.0)
EOS ABS: 0.1 10*3/uL (ref 0.0–0.7)
Eosinophils Relative: 1 % (ref 0.0–5.0)
HEMATOCRIT: 40.4 % (ref 36.0–46.0)
HEMOGLOBIN: 13.1 g/dL (ref 12.0–15.0)
LYMPHS ABS: 1.8 10*3/uL (ref 0.7–4.0)
Lymphocytes Relative: 24.1 % (ref 12.0–46.0)
MCHC: 32.3 g/dL (ref 30.0–36.0)
MCV: 96 fl (ref 78.0–100.0)
Monocytes Absolute: 0.7 10*3/uL (ref 0.1–1.0)
Monocytes Relative: 9.2 % (ref 3.0–12.0)
NEUTROS ABS: 4.8 10*3/uL (ref 1.4–7.7)
Neutrophils Relative %: 65.4 % (ref 43.0–77.0)
Platelets: 252 10*3/uL (ref 150.0–400.0)
RBC: 4.21 Mil/uL (ref 3.87–5.11)
RDW: 13.6 % (ref 11.5–14.6)
WBC: 7.3 10*3/uL (ref 4.5–10.5)

## 2013-11-30 LAB — PREALBUMIN: Prealbumin: 18.7 mg/dL (ref 17.0–34.0)

## 2013-11-30 LAB — MAGNESIUM: Magnesium: 2.2 mg/dL (ref 1.5–2.5)

## 2013-11-30 LAB — TSH: TSH: 0.59 u[IU]/mL (ref 0.35–5.50)

## 2013-11-30 LAB — VITAMIN B12: Vitamin B-12: 485 pg/mL (ref 211–911)

## 2013-11-30 MED ORDER — MIRTAZAPINE 7.5 MG PO TABS: 7.5000 mg | ORAL_TABLET | Freq: Every day | ORAL | Status: DC

## 2013-11-30 NOTE — Progress Notes (Signed)
Patient ID: [name], female   DOB: [birth year], 80 y.o.   MRN: [id number]  Patient Active Problem List   Diagnosis Date Noted  . Failure to thrive in adult 12/03/2013  . Chronic right hip pain 05/05/2013  . Adnexal mass 05/05/2013  . Unspecified vitamin D deficiency 02/11/2013  . Constipation 04/09/2012  . Chronic atrial fibrillation 07/26/2011  . Congestive heart failure with left ventricular systolic dysfunction 07/26/2011  . Arthritis   . Hypertension   . GERD (gastroesophageal reflux disease)   . Heart murmur   . Hyperlipidemia   . Diverticulosis of colon     Subjective:  CC:   Chief Complaint  Patient presents with  . Acute Visit    Patient reports does not feel good all over, everything is giving out.  . Depression    Stated by daughter.    HPI:   [name] is a 80 y.o. female who presents for faillure to thrive,  Patient not eating,  Crying frequently.  Per daughter she has become very mean spirited  irritable and depressed.  Refuses to be weighed, feels too weak to stand.  Patient denies pain,  Dysuria, abdominal pain and nausea.    Past Medical History  Diagnosis Date  . Incontinence of urine     bladder tac, collagen injections (Dr. Evelene CroonWolff)  . Diverticulosis     with bleeding  . Atrial fibrillation   . Arthritis     secondary to OA and DJD  . Hypertension   . GERD (gastroesophageal reflux disease)   . Heart murmur   . Hyperlipidemia   . CHF (congestive heart failure)     EF 35-35% by y last ECHO  . Diverticulosis of colon     Past Surgical History  Procedure Laterality Date  . Cholecystectomy    . Laminectomy    . Joint replacement      bilateral knee  . Cesarean section    . Shoulder hemi-arthroplasty  2010    right shoulder intramedullar rod and screw  . Spinal cord decompression      laminectomy       The following portions of the patient's history were reviewed and updated as appropriate: Allergies, current medications, and  problem list.    Review of Systems:   Patient denies headache, fevers, malaise, unintentional weight loss, skin rash, eye pain, sinus congestion and sinus pain, sore throat, dysphagia,  hemoptysis , cough, dyspnea, wheezing, chest pain, palpitations, orthopnea, edema, abdominal pain, nausea, melena, diarrhea, constipation, flank pain, dysuria, hematuria, urinary  Frequency, nocturia, numbness, tingling, seizures,  Focal weakness, Loss of consciousness,  Tremor, insomnia, depression, anxiety, and suicidal ideation.     History   Social History  . Marital Status: Widowed    Spouse Name: N/A    Number of Children: N/A  . Years of Education: N/A   Occupational History  . Not on file.   Social History Main Topics  . Smoking status: Never Smoker   . Smokeless tobacco: Never Used  . Alcohol Use: No  . Drug Use: No  . Sexual Activity: No   Other Topics Concern  . Not on file   Social History Narrative  . No narrative on file    Objective:  Filed Vitals:   11/30/13 1333  BP: 100/60  Pulse: 75  Temp: 97.3 F (36.3 C)  Resp: 16     General appearance: alert, cooperative and appears stated age Ears: normal TM's and external ear canals both ears Throat:  lips, mucosa, and tongue normal; teeth and gums normal Neck: no adenopathy, no carotid bruit, supple, symmetrical, trachea midline and thyroid not enlarged, symmetric, no tenderness/mass/nodules Back: symmetric, no curvature. ROM normal. No CVA tenderness. Lungs: clear to auscultation bilaterally Heart: regular rate and rhythm, S1, S2 normal, no murmur, click, rub or gallop Abdomen: soft, non-tender; bowel sounds normal; no masses,  no organomegaly Pulses: 2+ and symmetric Skin: Skin color, texture, turgor normal. No rashes or lesions Lymph nodes: Cervical, supraclavicular, and axillary nodes normal.  Assessment and Plan:  Failure to thrive in adult Will rule out infection, supratherapeutic digoxin level, and start  remeron for anhedonia, anorexia secondary to depression    Updated Medication List Outpatient Encounter Prescriptions as of 11/30/2013  Medication Sig  . Calcium Carbonate-Vitamin D 600-200 MG-UNIT TABS Take 1 tablet by mouth 2 (two) times daily.  . digoxin (LANOXIN) 0.125 MG tablet Take 1 tablet (125 mcg total) by mouth daily.  Marland Kitchen diltiazem (CARDIZEM CD) 240 MG 24 hr capsule Take 1 capsule (240 mg total) by mouth daily.  . fentaNYL (DURAGESIC - DOSED MCG/HR) 12 MCG/HR Place 12.5 mcg onto the skin every 3 (three) days.  . furosemide (LASIX) 20 MG tablet Take 1 tablet (20 mg total) by mouth daily.  Marland Kitchen losartan (COZAAR) 50 MG tablet Take 2 tablets (100 mg total) by mouth daily.  Marland Kitchen omeprazole (PRILOSEC) 40 MG capsule Take 1 capsule (40 mg total) by mouth daily.  . potassium chloride (K-DUR) 10 MEQ tablet Take 1 tablet (10 mEq total) by mouth daily.  . ergocalciferol (DRISDOL) 50000 UNITS capsule Take 1 capsule (50,000 Units total) by mouth once a week.  . mirtazapine (REMERON) 7.5 MG tablet Take 1 tablet (7.5 mg total) by mouth at bedtime. Increase to 2 tablets after 3 weeks  . polyethylene glycol powder (GLYCOLAX/MIRALAX) powder 17 gm inn 8 ounces of water daiily for one week, then as needed for constipation  . [DISCONTINUED] HYDROcodone-acetaminophen (NORCO/VICODIN) 5-325 MG per tablet Take 1 tablet by mouth at bedtime and may repeat dose one time if needed.  . [DISCONTINUED] mirtazapine (REMERON) 7.5 MG tablet Take 1 tablet (7.5 mg total) by mouth at bedtime. Increase to 2 tablets after 3 weeks     Orders Placed This Encounter  Procedures  . COMPLETE METABOLIC PANEL WITH GFR  . Magnesium  . Vitamin B12  . CBC with Differential  . TSH  . Vit D  25 hydroxy (rtn osteoporosis monitoring)  . Digoxin level  . Prealbumin  . POCT Urinalysis Dipstick    No Follow-up on file.

## 2013-11-30 NOTE — Telephone Encounter (Signed)
Patient was scheduled 2.19.15 @ 1:30.

## 2013-11-30 NOTE — Patient Instructions (Signed)
I am prescribing mirtazipine for your depressed mood and lack of appetite/energy  The starting dose  is 7.5 mg at bedtime,  But we will increase the dose in 3 weeks to 15 mg   I will see yo back in 4 to 5 weeks to see how you are doing/if it is helping

## 2013-11-30 NOTE — Progress Notes (Signed)
Pre-visit discussion using our clinic review tool. No additional management support is needed unless otherwise documented below in the visit note.  

## 2013-12-01 ENCOUNTER — Telehealth: Payer: Self-pay | Admitting: Internal Medicine

## 2013-12-01 LAB — POCT URINALYSIS DIPSTICK
Bilirubin, UA: NEGATIVE
Glucose, UA: NEGATIVE
Ketones, UA: NEGATIVE
Leukocytes, UA: NEGATIVE
NITRITE UA: NEGATIVE
Protein, UA: NEGATIVE
Spec Grav, UA: 1.015
Urobilinogen, UA: 0.2
pH, UA: 6

## 2013-12-01 LAB — DIGOXIN LEVEL: Digoxin Level: 2.2 ng/mL — ABNORMAL HIGH (ref 0.8–2.0)

## 2013-12-01 LAB — VITAMIN D 25 HYDROXY (VIT D DEFICIENCY, FRACTURES): Vit D, 25-Hydroxy: 66 ng/mL (ref 30–89)

## 2013-12-01 MED ORDER — HYDROCODONE-ACETAMINOPHEN 5-325 MG PO TABS: 1.0000 | ORAL_TABLET | Freq: Every evening | ORAL | Status: DC | PRN

## 2013-12-01 NOTE — Telephone Encounter (Signed)
[facility] please advise ok to fill?

## 2013-12-01 NOTE — Telephone Encounter (Signed)
Daughter came in to pick up prescription for Norco.  States the pt home just called over and left a message.  Advised that it is not available at this time.  She would like to come in to pick up prescription on Monday if possible.  States her mother only has #4 pills left.  Advised she call before she comes.

## 2013-12-01 NOTE — Telephone Encounter (Signed)
Ok to refill,  printed rx  

## 2013-12-03 ENCOUNTER — Encounter: Payer: Self-pay | Admitting: Internal Medicine

## 2013-12-03 DIAGNOSIS — R627 Adult failure to thrive: Secondary | ICD-10-CM | POA: Insufficient documentation

## 2013-12-03 NOTE — Assessment & Plan Note (Addendum)
Will rule out infection, supratherapeutic digoxin level, and start remeron for anhedonia, anorexia secondary to depression

## 2013-12-04 ENCOUNTER — Encounter: Payer: Self-pay | Admitting: *Deleted

## 2013-12-04 NOTE — Telephone Encounter (Signed)
Patient notified script ready for pick up placed up front.

## 2013-12-28 ENCOUNTER — Other Ambulatory Visit: Payer: Self-pay | Admitting: Internal Medicine

## 2013-12-28 NOTE — Telephone Encounter (Signed)
Refill

## 2014-01-01 ENCOUNTER — Encounter: Payer: Self-pay | Admitting: Internal Medicine

## 2014-01-01 ENCOUNTER — Ambulatory Visit (INDEPENDENT_AMBULATORY_CARE_PROVIDER_SITE_OTHER): Payer: Commercial Managed Care - HMO | Admitting: Internal Medicine

## 2014-01-01 VITALS — BP 140/68 | HR 84 | Temp 97.7°F | Resp 16

## 2014-01-01 DIAGNOSIS — T460X5A Adverse effect of cardiac-stimulant glycosides and drugs of similar action, initial encounter: Secondary | ICD-10-CM

## 2014-01-01 DIAGNOSIS — F32A Depression, unspecified: Secondary | ICD-10-CM

## 2014-01-01 DIAGNOSIS — T460X1A Poisoning by cardiac-stimulant glycosides and drugs of similar action, accidental (unintentional), initial encounter: Secondary | ICD-10-CM

## 2014-01-01 DIAGNOSIS — F329 Major depressive disorder, single episode, unspecified: Secondary | ICD-10-CM | POA: Insufficient documentation

## 2014-01-01 DIAGNOSIS — G8929 Other chronic pain: Secondary | ICD-10-CM

## 2014-01-01 DIAGNOSIS — M25559 Pain in unspecified hip: Secondary | ICD-10-CM

## 2014-01-01 DIAGNOSIS — M25551 Pain in right hip: Secondary | ICD-10-CM

## 2014-01-01 DIAGNOSIS — F3289 Other specified depressive episodes: Secondary | ICD-10-CM

## 2014-01-01 DIAGNOSIS — I482 Chronic atrial fibrillation, unspecified: Secondary | ICD-10-CM

## 2014-01-01 DIAGNOSIS — I4891 Unspecified atrial fibrillation: Secondary | ICD-10-CM

## 2014-01-01 NOTE — Progress Notes (Signed)
Patient ID: Jennifer ChesterArmelia Silveria, female   DOB: November 20, 1914, 80 y.o.   MRN: [id number]  Patient Active Problem List   Diagnosis Date Noted  . Depression (emotion) 01/01/2014  . Failure to thrive in adult 12/03/2013  . Chronic right hip pain 05/05/2013  . Adnexal mass 05/05/2013  . Unspecified vitamin D deficiency 02/11/2013  . Constipation 04/09/2012  . Chronic atrial fibrillation 07/26/2011  . Congestive heart failure with left ventricular systolic dysfunction 07/26/2011  . Arthritis   . Hypertension   . GERD (gastroesophageal reflux disease)   . Heart murmur   . Hyperlipidemia   . Diverticulosis of colon     Subjective:  CC:   Chief Complaint  Patient presents with  . Follow-up    1 month    HPI:   [name] is a 80 y.o. female who presents for  Follow up on depression ,recently diagnosed with mood changes including irritability, decreased sociability and anhedonia .  Screening labs were done and supratherapeutic digoxin level was found.  Digoxin was suspended and resumed at 50% dose.  Remeron was started at 7. 5 mg daily  And tolerate.  She is accompanied by daughter Galen DaftJanette, who reports that  patient's mood has improved. She has not lost any weight and her appetite has improved.  Her low back pain is somewhat persistent and  Aggravating by chanign positions but is restricting her use o  Once daily at night.    Past Medical History  Diagnosis Date  . Incontinence of urine     bladder tac, collagen injections (Dr. Evelene CroonWolff)  . Diverticulosis     with bleeding  . Atrial fibrillation   . Arthritis     secondary to OA and DJD  . Hypertension   . GERD (gastroesophageal reflux disease)   . Heart murmur   . Hyperlipidemia   . CHF (congestive heart failure)     EF 35-35% by y last ECHO  . Diverticulosis of colon     Past Surgical History  Procedure Laterality Date  . Cholecystectomy    . Laminectomy    . Joint replacement      bilateral knee  . Cesarean section    .  Shoulder hemi-arthroplasty  2010    right shoulder intramedullar rod and screw  . Spinal cord decompression      laminectomy       The following portions of the patient's history were reviewed and updated as appropriate: Allergies, current medications, and problem list.    Review of Systems:   Patient denies headache, fevers, malaise, unintentional weight loss, skin rash, eye pain, sinus congestion and sinus pain, sore throat, dysphagia,  hemoptysis , cough, dyspnea, wheezing, chest pain, palpitations, orthopnea, edema, abdominal pain, nausea, melena, diarrhea, constipation, flank pain, dysuria, hematuria, urinary  Frequency, nocturia, numbness, tingling, seizures,  Focal weakness, Loss of consciousness,  Tremor, insomnia, depression, anxiety, and suicidal ideation.     History   Social History  . Marital Status: Widowed    Spouse Name: N/A    Number of Children: N/A  . Years of Education: N/A   Occupational History  . Not on file.   Social History Main Topics  . Smoking status: Never Smoker   . Smokeless tobacco: Never Used  . Alcohol Use: No  . Drug Use: No  . Sexual Activity: No   Other Topics Concern  . Not on file   Social History Narrative  . No narrative on file    Objective:  Filed  Vitals:   01/01/14 1400  BP: 140/68  Pulse: 84  Temp: 97.7 F (36.5 C)  Resp: 16     General appearance: alert, cooperative and appears stated age Ears: normal TM's and external ear canals both ears Throat: lips, mucosa, and tongue normal; teeth and gums normal Neck: no adenopathy, no carotid bruit, supple, symmetrical, trachea midline and thyroid not enlarged, symmetric, no tenderness/mass/nodules Back: symmetric, no curvature. ROM normal. No CVA tenderness. Lungs: clear to auscultation bilaterally Heart: regular rate and rhythm, S1, S2 normal, no murmur, click, rub or gallop Abdomen: soft, non-tender; bowel sounds normal; no masses,  no organomegaly Pulses: 2+ and  symmetric Skin: Skin color, texture, turgor normal. No rashes or lesions Lymph nodes: Cervical, supraclavicular, and axillary nodes normal.  Assessment and Plan:  Depression (emotion) Improvement in all negative symptoms with remeron 7.5 mg daily continue current dose   Chronic atrial fibrillation Rate contreolled with diltiazem and digoxin.  Stopping digoxin if reduced dose continues to cause supratherapeutic level .  Chronic right hip pain Managed with qhs  vicodin  Adding scheduled tylenol 1000 mg in the morning.    Updated Medication List Outpatient Encounter Prescriptions as of 01/01/2014  Medication Sig  . Calcium Carbonate-Vitamin D 600-200 MG-UNIT TABS Take 1 tablet by mouth 2 (two) times daily.  . digoxin (LANOXIN) 0.125 MG tablet Take 1 tablet (125 mcg total) by mouth daily.  Marland Kitchen diltiazem (CARDIZEM CD) 240 MG 24 hr capsule Take 1 capsule (240 mg total) by mouth daily.  . fentaNYL (DURAGESIC - DOSED MCG/HR) 12 MCG/HR Place 12.5 mcg onto the skin every 3 (three) days.  . furosemide (LASIX) 20 MG tablet Take 1 tablet (20 mg total) by mouth daily.  Marland Kitchen HYDROcodone-acetaminophen (NORCO/VICODIN) 5-325 MG per tablet Take 1 tablet by mouth at bedtime and may repeat dose one time if needed.  Marland Kitchen losartan (COZAAR) 50 MG tablet Take 2 tablets (100 mg total) by mouth daily.  . mirtazapine (REMERON) 7.5 MG tablet Take 1 tablet (7.5 mg total) by mouth at bedtime. Increase to 2 tablets after 3 weeks  . omeprazole (PRILOSEC) 40 MG capsule Take 1 capsule (40 mg total) by mouth daily.  . polyethylene glycol powder (GLYCOLAX/MIRALAX) powder 17 gm inn 8 ounces of water daiily for one week, then as needed for constipation  . potassium chloride (K-DUR) 10 MEQ tablet Take 1 tablet (10 mEq total) by mouth daily.  . Vitamin D, Ergocalciferol, (DRISDOL) 50000 UNITS CAPS capsule TAKE 1 CAPSULE BY MOUTH ONCE WEEKLY FOR SUPPLEMENT

## 2014-01-01 NOTE — Assessment & Plan Note (Signed)
Improvement in all negative symptoms with remeron 7.5 mg daily continue current dose

## 2014-01-01 NOTE — Assessment & Plan Note (Signed)
Managed with qhs  vicodin  Adding scheduled tylenol 1000 mg in the morning.

## 2014-01-01 NOTE — Patient Instructions (Addendum)
You are doing well  If your digoxin level is high again, we will stop it completely  I will have 2 tylenol scheduled for mid morning to help your daytime pain

## 2014-01-01 NOTE — Assessment & Plan Note (Signed)
Rate contreolled with diltiazem and digoxin.  Stopping digoxin if reduced dose continues to cause supratherapeutic level .

## 2014-01-01 NOTE — Progress Notes (Signed)
Pre-visit discussion using our clinic review tool. No additional management support is needed unless otherwise documented below in the visit note.  

## 2014-01-02 ENCOUNTER — Encounter: Payer: Self-pay | Admitting: *Deleted

## 2014-01-02 LAB — DIGOXIN LEVEL: DIGOXIN LVL: 0.8 ng/mL (ref 0.8–2.0)

## 2014-01-05 ENCOUNTER — Other Ambulatory Visit: Payer: Self-pay | Admitting: Internal Medicine

## 2014-01-22 ENCOUNTER — Other Ambulatory Visit: Payer: Self-pay | Admitting: Internal Medicine

## 2014-01-22 NOTE — Telephone Encounter (Signed)
Electronic Rx request, please advise. 

## 2014-01-31 ENCOUNTER — Other Ambulatory Visit: Payer: Self-pay | Admitting: Internal Medicine

## 2014-01-31 NOTE — Telephone Encounter (Signed)
Ok refill? 

## 2014-02-11 ENCOUNTER — Emergency Department: Payer: Self-pay | Admitting: Emergency Medicine

## 2014-02-11 LAB — URINALYSIS, COMPLETE
BACTERIA: NONE SEEN
Bilirubin,UR: NEGATIVE
Glucose,UR: NEGATIVE mg/dL (ref 0–75)
KETONE: NEGATIVE
LEUKOCYTE ESTERASE: NEGATIVE
Nitrite: NEGATIVE
PH: 5 (ref 4.5–8.0)
Protein: NEGATIVE
RBC,UR: 5 /HPF (ref 0–5)
Specific Gravity: 1.017 (ref 1.003–1.030)
Squamous Epithelial: <1

## 2014-02-11 LAB — COMPREHENSIVE METABOLIC PANEL
ALBUMIN: 4.1 g/dL (ref 3.4–5.0)
ALT: 15 U/L (ref 12–78)
ANION GAP: 5 — AB (ref 7–16)
Alkaline Phosphatase: 71 U/L
BUN: 14 mg/dL (ref 7–18)
Bilirubin,Total: 0.6 mg/dL (ref 0.2–1.0)
CALCIUM: 9.8 mg/dL (ref 8.5–10.1)
CO2: 32 mmol/L (ref 21–32)
Chloride: 102 mmol/L (ref 98–107)
Creatinine: 0.78 mg/dL (ref 0.60–1.30)
EGFR (Non-African Amer.): >60
GLUCOSE: 110 mg/dL — AB (ref 65–99)
OSMOLALITY: 279 (ref 275–301)
Potassium: 4.3 mmol/L (ref 3.5–5.1)
SGOT(AST): 13 U/L — ABNORMAL LOW (ref 15–37)
Sodium: 139 mmol/L (ref 136–145)
TOTAL PROTEIN: 8.1 g/dL (ref 6.4–8.2)

## 2014-02-11 LAB — CBC
HCT: 39.8 % (ref 35.0–47.0)
HGB: 13.4 g/dL (ref 12.0–16.0)
MCH: 32 pg (ref 26.0–34.0)
MCHC: 33.6 g/dL (ref 32.0–36.0)
MCV: 95 fL (ref 80–100)
Platelet: 220 10*3/uL (ref 150–440)
RBC: 4.18 10*6/uL (ref 3.80–5.20)
RDW: 14.3 % (ref 11.5–14.5)
WBC: 7.2 10*3/uL (ref 3.6–11.0)

## 2014-02-11 LAB — PROTIME-INR
INR: 1
Prothrombin Time: 12.8 secs (ref 11.5–14.7)

## 2014-02-11 LAB — TROPONIN I: Troponin-I: <0.02 ng/mL

## 2014-03-09 ENCOUNTER — Other Ambulatory Visit: Payer: Self-pay | Admitting: Internal Medicine

## 2014-03-27 ENCOUNTER — Other Ambulatory Visit: Payer: Self-pay | Admitting: Internal Medicine

## 2014-03-27 NOTE — Telephone Encounter (Signed)
A user error has taken place.

## 2014-04-12 ENCOUNTER — Other Ambulatory Visit: Payer: Self-pay | Admitting: Internal Medicine

## 2014-04-25 ENCOUNTER — Telehealth: Payer: Self-pay | Admitting: Internal Medicine

## 2014-04-25 MED ORDER — HYDROCODONE-ACETAMINOPHEN 5-325 MG PO TABS: ORAL_TABLET | ORAL | Status: DC

## 2014-04-25 NOTE — Telephone Encounter (Signed)
Ok to refill,  printed rx  

## 2014-04-25 NOTE — Telephone Encounter (Signed)
Mrs [name] called to request refill on hydrocodone. Mrs. [name] stated that the patient was completey out of rx. Please call when ready for pick up/msn

## 2014-04-25 NOTE — Telephone Encounter (Signed)
Ok to fill 

## 2014-04-26 NOTE — Telephone Encounter (Signed)
Prescription ready for pick up its in the drawer and daughter is aware.

## 2014-04-27 ENCOUNTER — Ambulatory Visit: Payer: Commercial Managed Care - HMO | Admitting: Internal Medicine

## 2014-04-27 ENCOUNTER — Other Ambulatory Visit: Payer: Self-pay | Admitting: Internal Medicine

## 2014-04-27 NOTE — Telephone Encounter (Signed)
Last refill 6.16.15, last OV 3.23.15.  Please advise refill.

## 2014-04-29 NOTE — Telephone Encounter (Signed)
rx sent to [facility] via [facility][pt

## 2014-04-30 NOTE — Telephone Encounter (Signed)
rx sent, pt notified  

## 2014-05-07 ENCOUNTER — Other Ambulatory Visit: Payer: Self-pay | Admitting: Internal Medicine

## 2014-05-26 ENCOUNTER — Other Ambulatory Visit: Payer: Self-pay | Admitting: Internal Medicine

## 2014-06-20 ENCOUNTER — Encounter: Payer: Self-pay | Admitting: Internal Medicine

## 2014-06-20 ENCOUNTER — Ambulatory Visit (INDEPENDENT_AMBULATORY_CARE_PROVIDER_SITE_OTHER): Payer: Commercial Managed Care - HMO | Admitting: Internal Medicine

## 2014-06-20 VITALS — BP 122/60 | HR 76

## 2014-06-20 DIAGNOSIS — H6123 Impacted cerumen, bilateral: Secondary | ICD-10-CM

## 2014-06-20 DIAGNOSIS — Z23 Encounter for immunization: Secondary | ICD-10-CM

## 2014-06-20 DIAGNOSIS — H918X9 Other specified hearing loss, unspecified ear: Secondary | ICD-10-CM

## 2014-06-20 DIAGNOSIS — H6120 Impacted cerumen, unspecified ear: Secondary | ICD-10-CM

## 2014-06-20 NOTE — Progress Notes (Signed)
Pre visit review using our clinic review tool, if applicable. No additional management support is needed unless otherwise documented below in the visit note. 

## 2014-06-23 DIAGNOSIS — H6123 Impacted cerumen, bilateral: Secondary | ICD-10-CM | POA: Insufficient documentation

## 2014-06-23 NOTE — Assessment & Plan Note (Signed)
Patient underwent disimpaction of cerumen bilaterally with warm water irrigation. Procedure was tolerated well. Return prn.

## 2014-06-23 NOTE — Progress Notes (Signed)
Patient ID: Edger House, female   DOB: Feb 20, 1915, 80 y.o.   MRN: [id number]  Patient Active Problem List   Diagnosis Date Noted  . Bilateral hearing loss due to cerumen impaction 06/23/2014  . Depression (emotion) 01/01/2014  . Failure to thrive in adult 12/03/2013  . Chronic right hip pain 05/05/2013  . Adnexal mass 05/05/2013  . Unspecified vitamin D deficiency 02/11/2013  . Constipation 04/09/2012  . Chronic atrial fibrillation 07/26/2011  . Congestive heart failure with left ventricular systolic dysfunction 07/26/2011  . Arthritis   . Hypertension   . GERD (gastroesophageal reflux disease)   . Heart murmur   . Hyperlipidemia   . Diverticulosis of colon     Subjective:  CC:   Chief Complaint  Patient presents with  . difficulty hearing    HPI:   MARSELLA SUMAN is a 80 y.o. female who presents for  Decreased hearing secondary to bilateral cerumen impaction. Patient is here to have disimpaction.    Past Medical History  Diagnosis Date  . Incontinence of urine     bladder tac, collagen injections (Dr. Evelene Croon)  . Diverticulosis     with bleeding  . Atrial fibrillation   . Arthritis     secondary to OA and DJD  . Hypertension   . GERD (gastroesophageal reflux disease)   . Heart murmur   . Hyperlipidemia   . CHF (congestive heart failure)     EF 35-35% by y last ECHO  . Diverticulosis of colon     Past Surgical History  Procedure Laterality Date  . Cholecystectomy    . Laminectomy    . Joint replacement      bilateral knee  . Cesarean section    . Shoulder hemi-arthroplasty  2010    right shoulder intramedullar rod and screw  . Spinal cord decompression      laminectomy       The following portions of the patient's history were reviewed and updated as appropriate: Allergies, current medications, and problem list.    Review of Systems:   Patient denies headache, fevers, malaise, unintentional weight loss, skin rash, eye pain, sinus  congestion and sinus pain, sore throat, dysphagia,  hemoptysis , cough, dyspnea, wheezing, chest pain, palpitations, orthopnea, edema, abdominal pain, nausea, melena, diarrhea, constipation, flank pain, dysuria, hematuria, urinary  Frequency, nocturia, numbness, tingling, seizures,  Focal weakness, Loss of consciousness,  Tremor, insomnia, depression, anxiety, and suicidal ideation.     History   Social History  . Marital Status: Widowed    Spouse Name: N/A    Number of Children: N/A  . Years of Education: N/A   Occupational History  . Not on file.   Social History Main Topics  . Smoking status: Never Smoker   . Smokeless tobacco: Never Used  . Alcohol Use: No  . Drug Use: No  . Sexual Activity: No   Other Topics Concern  . Not on file   Social History Narrative  . No narrative on file    Objective:  Filed Vitals:   06/20/14 1327  BP: 122/60  Pulse: 76     General appearance: alert, cooperative and appears stated age Ears: bilateral cerumen impaction  Throat: lips, mucosa, and tongue normal; teeth and gums normal   Assessment and Plan:  Bilateral hearing loss due to cerumen impaction Patient underwent disimpaction of cerumen bilaterally with warm water irrigation. Procedure was tolerated well. Return prn.    Updated Medication List Outpatient Encounter Prescriptions as of  06/20/2014  Medication Sig  . Calcium Carbonate-Vitamin D 600-200 MG-UNIT TABS Take 1 tablet by mouth 2 (two) times daily.  . Calcium Carbonate-Vitamin D 600-400 MG-UNIT per tablet TAKE 1 TABLET BY MOUTH 2 TIMES A DAY WITH MEALS.  Marland Kitchen digoxin (LANOXIN) 0.125 MG tablet Take 0.5 tablets (0.0625 mg total) by mouth daily.  Marland Kitchen diltiazem (CARDIZEM CD) 240 MG 24 hr capsule TAKE ONE CAPSULE BY MOUTH ONCE DAILY FORHEART AND BLOOD PRESSURE (HIGH)  . fentaNYL (DURAGESIC - DOSED MCG/HR) 12 MCG/HR Place 12.5 mcg onto the skin every 3 (three) days.  . furosemide (LASIX) 20 MG tablet TAKE 1 TABLET BY MOUTH ONCE  DAILY FOR EDEMA  . HYDROcodone-acetaminophen (NORCO/VICODIN) 5-325 MG per tablet TAKE 1 TABLET BY MOUTH ONCE DAILY AT BEDTIME. MAY REPEAT ONCE IF NEEDED.  Marland Kitchen losartan (COZAAR) 50 MG tablet TAKE 1 TABLET ONCE DAILY FOR HIGH BLOOD PRESSURE  . MAPAP 500 MG tablet TAKE 2 TABLETS ( ) BY MOUTH EACH MORNING AFTER BREAKFAST.  . mirtazapine (REMERON) 7.5 MG tablet Take 1 tablet (7.5 mg total) by mouth at bedtime. Increase to 2 tablets after 3 weeks  . omeprazole (PRILOSEC) 40 MG capsule TAKE ONE CAPSULE BY MOUTH ONCE DAILY FOR GERD  . polyethylene glycol powder (GLYCOLAX/MIRALAX) powder 17 gm inn 8 ounces of water daiily for one week, then as needed for constipation  . potassium chloride (K-DUR) 10 MEQ tablet TAKE 1 TABLET BY MOUTH DAILY WITH FOOD AND FLUIDS, FOR SUPPLEMENT  . Vitamin D, Ergocalciferol, (DRISDOL) 50000 UNITS CAPS capsule TAKE 1 CAPSULE BY MOUTH ONCE WEEKLY FOR SUPPLEMENT     Orders Placed This Encounter  Procedures  . Flu Vaccine QUAD 36+ mos PF IM (Fluarix Quad PF)    No Follow-up on file.

## 2014-07-02 ENCOUNTER — Other Ambulatory Visit: Payer: Self-pay | Admitting: Internal Medicine

## 2014-07-09 ENCOUNTER — Other Ambulatory Visit: Payer: Self-pay | Admitting: Internal Medicine

## 2014-07-30 ENCOUNTER — Other Ambulatory Visit: Payer: Self-pay | Admitting: Internal Medicine

## 2014-08-06 ENCOUNTER — Other Ambulatory Visit: Payer: Self-pay | Admitting: Internal Medicine

## 2014-08-16 ENCOUNTER — Other Ambulatory Visit: Payer: Self-pay | Admitting: Internal Medicine

## 2014-08-16 NOTE — Telephone Encounter (Signed)
Refill? Last Vitamin D lab 2/15

## 2014-08-17 NOTE — Telephone Encounter (Signed)
REfill denied,  Last Vit D level was Feb 2015 and was normal.  Needs to take Vit D3 1000 units daily available OTC

## 2014-08-31 ENCOUNTER — Telehealth: Payer: Self-pay | Admitting: Internal Medicine

## 2014-08-31 NOTE — Telephone Encounter (Signed)
Can this patient be pit on Naomie Deanarrie Doss NP schedule?

## 2014-08-31 NOTE — Telephone Encounter (Signed)
Pt daughter called to setup appt for pt to been seen for knee pain and having a hard time walking. Please advise where to add to schedule.msn

## 2014-09-01 NOTE — Telephone Encounter (Signed)
Yes,  It is a chronic problem.

## 2014-09-03 ENCOUNTER — Other Ambulatory Visit: Payer: Self-pay | Admitting: Internal Medicine

## 2014-09-03 NOTE — Telephone Encounter (Signed)
Pt scheduled, thanks! 

## 2014-09-03 NOTE — Telephone Encounter (Signed)
Please call and schedule patient. Thanks! 

## 2014-09-11 ENCOUNTER — Encounter: Payer: Self-pay | Admitting: Nurse Practitioner

## 2014-09-11 ENCOUNTER — Ambulatory Visit (INDEPENDENT_AMBULATORY_CARE_PROVIDER_SITE_OTHER): Payer: Commercial Managed Care - HMO | Admitting: Nurse Practitioner

## 2014-09-11 ENCOUNTER — Encounter (INDEPENDENT_AMBULATORY_CARE_PROVIDER_SITE_OTHER): Payer: Self-pay

## 2014-09-11 VITALS — BP 102/66 | HR 66 | Resp 14

## 2014-09-11 DIAGNOSIS — M1711 Unilateral primary osteoarthritis, right knee: Secondary | ICD-10-CM

## 2014-09-11 DIAGNOSIS — R262 Difficulty in walking, not elsewhere classified: Secondary | ICD-10-CM

## 2014-09-11 DIAGNOSIS — M179 Osteoarthritis of knee, unspecified: Secondary | ICD-10-CM

## 2014-09-11 DIAGNOSIS — M1710 Unilateral primary osteoarthritis, unspecified knee: Secondary | ICD-10-CM | POA: Insufficient documentation

## 2014-09-11 DIAGNOSIS — M259 Joint disorder, unspecified: Secondary | ICD-10-CM

## 2014-09-11 MED ORDER — HYDROCODONE-ACETAMINOPHEN 5-325 MG PO TABS: ORAL_TABLET | ORAL | Status: DC

## 2014-09-11 NOTE — Progress Notes (Signed)
Pre visit review using our clinic review tool, if applicable. No additional management support is needed unless otherwise documented below in the visit note. 

## 2014-09-11 NOTE — Patient Instructions (Addendum)
The office will call you with information regarding Physical Therapy for your knee. Nice to meet you today!

## 2014-09-11 NOTE — Assessment & Plan Note (Signed)
Stable. Gave Rx for continuing Norco 5-325 mg since she was out. She is to take at night and instructions were reiterated to her and daughter. This is a continuation of pain management from the chronic hip pain. Referral for more PT by same group for help with knee and strengthening legs. Discussed coming back if worse or symptoms fail to improve with these measures.

## 2014-09-11 NOTE — Progress Notes (Signed)
Subjective:    Patient ID: [name], female    DOB: Feb 28, 1915, 80 y.o.   MRN: [id number]  HPI  [name] is a 80 yo female with right knee pain. She is accompanied by her daughter today.   1) Right knee pain- This began 30 years ago she stated. Pain is bothering her more lately and she has run out of her pain medication. The morning is worse when she gets out of bed. After an hour she moves around and it feels better. She has bilateral knee replacements after an accident per her daughter. She has had [facility] in the past for increasing strength in legs and working on walking. She has lost strength again in her legs. She can lift her legs from floor to foot step of wheelchair. Daughter says knees have been x-[facility]ed at [facility] with no findings not long ago (unsure of date).   Review of Systems  Positive for: depression, chronic right hip pain, and chronic right knee pain.   Denies fever, chills, sweats, leg swelling, redness, suicidal ideation.   Past Medical History  Diagnosis Date  . Incontinence of urine     bladder tac, collagen injections (Dr. Evelene CroonWolff)  . Diverticulosis     with bleeding  . Atrial fibrillation   . Arthritis     secondary to OA and DJD  . Hypertension   . GERD (gastroesophageal reflux disease)   . Heart murmur   . Hyperlipidemia   . CHF (congestive heart failure)     EF 35-35% by y last ECHO  . Diverticulosis of colon     History   Social History  . Marital Status: Widowed    Spouse Name: N/A    Number of Children: N/A  . Years of Education: N/A   Occupational History  . Not on file.   Social History Main Topics  . Smoking status: Never Smoker   . Smokeless tobacco: Never Used  . Alcohol Use: No  . Drug Use: No  . Sexual Activity: No   Other Topics Concern  . Not on file   Social History Narrative    Past Surgical History  Procedure Laterality Date  . Cholecystectomy    . Laminectomy    . Cesarean section    .  Shoulder hemi-arthroplasty  2010    right shoulder intramedullar rod and screw  . Spinal cord decompression      laminectomy  . Joint replacement      bilateral knee    Family History  Problem Relation Age of Onset  . Cancer Son     renal cell carcinoma    Allergies  Allergen Reactions  . Aspirin   . Sulfa Antibiotics     Current Outpatient Prescriptions on File Prior to Visit  Medication Sig Dispense Refill  . Calcium Carbonate-Vitamin D 600-200 MG-UNIT TABS Take 1 tablet by mouth 2 (two) times daily. 60 tablet 6  . digoxin (LANOXIN) 0.125 MG tablet TAKE 1/2 TABLET BY MOUTH EACH MORNING FOR HEART 15 tablet 5  . diltiazem (CARDIZEM CD) 240 MG 24 hr capsule TAKE ONE CAPSULE BY MOUTH ONCE DAILY FORHEART AND BLOOD PRESSURE (HIGH) 30 capsule 5  . fentaNYL (DURAGESIC - DOSED MCG/HR) 12 MCG/HR Place 12.5 mcg onto the skin every 3 (three) days.    . furosemide (LASIX) 20 MG tablet TAKE 1 TABLET BY MOUTH ONCE DAILY FOR EDEMA 90 tablet 1  . losartan (COZAAR) 50 MG tablet TAKE 1  TABLET ONCE DAILY FOR HIGH BLOOD PRESSURE 30 tablet 6  . MAPAP 500 MG tablet TAKE 2 TABLETS (1000mg ) BY MOUTH EACH MORNING AFTER BREAKFAST. 60 tablet 11  . mirtazapine (REMERON) 7.5 MG tablet Take 1 tablet (7.5 mg total) by mouth at bedtime. Increase to 2 tablets after 3 weeks 60 tablet 11  . omeprazole (PRILOSEC) 40 MG capsule TAKE ONE CAPSULE BY MOUTH ONCE DAILY FOR GERD 30 capsule 5  . polyethylene glycol powder (GLYCOLAX/MIRALAX) powder 17 gm inn 8 ounces of water daiily for one week, then as needed for constipation 3350 g 1  . potassium chloride (K-DUR) 10 MEQ tablet TAKE 1 TABLET BY MOUTH DAILY WITH FOOD AND FLUIDS, FOR SUPPLEMENT 30 tablet 5  . Vitamin D, Ergocalciferol, (DRISDOL) 50000 UNITS CAPS capsule TAKE 1 CAPSULE BY MOUTH ONCE WEEKLY FOR SUPPLEMENT 4 capsule 5   No current facility-administered medications on file prior to visit.    BP 102/66 mmHg  Pulse 66  Resp 14  Wt   SpO2 97%        Objective:   Physical Exam  Constitutional: No distress.  Cardiovascular: Normal rate and regular rhythm.   Pulmonary/Chest: Effort normal and breath sounds normal.  Musculoskeletal: She exhibits no edema or tenderness.  Left and Right knees have well healed scars from TKA and are hypertrophic. The muscles are atrophic in her legs bilaterally.   Neurological: She is alert.  Skin: Skin is warm and dry. She is not diaphoretic.  Psychiatric: Judgment and thought content normal.        Assessment & Plan:

## 2014-09-20 ENCOUNTER — Telehealth: Payer: Self-pay

## 2014-09-20 NOTE — Telephone Encounter (Signed)
FYI; I gave verbal for incorporation of massage therapy into PT.

## 2014-09-20 NOTE — Telephone Encounter (Signed)
[name], Physical therapist - callback [phone]- [phone]  - is in need of a verbal order to incorporate massage therapy into her routine with the pt.

## 2014-09-20 NOTE — Telephone Encounter (Signed)
thanks

## 2014-10-01 ENCOUNTER — Other Ambulatory Visit: Payer: Self-pay | Admitting: Internal Medicine

## 2014-10-22 ENCOUNTER — Ambulatory Visit: Payer: Commercial Managed Care - HMO | Admitting: Internal Medicine

## 2014-10-29 ENCOUNTER — Ambulatory Visit: Payer: Commercial Managed Care - HMO | Admitting: Internal Medicine

## 2014-10-30 ENCOUNTER — Ambulatory Visit (INDEPENDENT_AMBULATORY_CARE_PROVIDER_SITE_OTHER): Payer: Commercial Managed Care - HMO | Admitting: Internal Medicine

## 2014-10-30 ENCOUNTER — Encounter: Payer: Self-pay | Admitting: Internal Medicine

## 2014-10-30 VITALS — BP 120/58 | HR 71 | Temp 97.4°F | Resp 14 | Wt 108.0 lb

## 2014-10-30 DIAGNOSIS — N949 Unspecified condition associated with female genital organs and menstrual cycle: Secondary | ICD-10-CM

## 2014-10-30 DIAGNOSIS — I10 Essential (primary) hypertension: Secondary | ICD-10-CM

## 2014-10-30 DIAGNOSIS — N9489 Other specified conditions associated with female genital organs and menstrual cycle: Secondary | ICD-10-CM

## 2014-10-30 DIAGNOSIS — I482 Chronic atrial fibrillation, unspecified: Secondary | ICD-10-CM

## 2014-10-30 DIAGNOSIS — I5020 Unspecified systolic (congestive) heart failure: Secondary | ICD-10-CM

## 2014-10-30 DIAGNOSIS — M1711 Unilateral primary osteoarthritis, right knee: Secondary | ICD-10-CM

## 2014-10-30 DIAGNOSIS — K5900 Constipation, unspecified: Secondary | ICD-10-CM

## 2014-10-30 DIAGNOSIS — M179 Osteoarthritis of knee, unspecified: Secondary | ICD-10-CM

## 2014-10-30 DIAGNOSIS — Z23 Encounter for immunization: Secondary | ICD-10-CM

## 2014-10-30 DIAGNOSIS — R5383 Other fatigue: Secondary | ICD-10-CM

## 2014-10-30 DIAGNOSIS — Z79899 Other long term (current) drug therapy: Secondary | ICD-10-CM

## 2014-10-30 DIAGNOSIS — E559 Vitamin D deficiency, unspecified: Secondary | ICD-10-CM

## 2014-10-30 MED ORDER — POTASSIUM CHLORIDE ER 10 MEQ PO TBCR: EXTENDED_RELEASE_TABLET | ORAL | Status: DC

## 2014-10-30 MED ORDER — LOSARTAN POTASSIUM 50 MG PO TABS: ORAL_TABLET | ORAL | Status: DC

## 2014-10-30 MED ORDER — CALCIUM CARBONATE-VITAMIN D 600-200 MG-UNIT PO TABS: ORAL_TABLET | ORAL | Status: DC

## 2014-10-30 MED ORDER — HYDROCODONE-ACETAMINOPHEN 5-325 MG PO TABS: ORAL_TABLET | ORAL | Status: DC

## 2014-10-30 MED ORDER — VITAMIN D (ERGOCALCIFEROL) 1.25 MG (50000 UNIT) PO CAPS: ORAL_CAPSULE | ORAL | Status: DC

## 2014-10-30 MED ORDER — OMEPRAZOLE 40 MG PO CPDR: 40.0000 mg | DELAYED_RELEASE_CAPSULE | Freq: Every day | ORAL | Status: DC

## 2014-10-30 MED ORDER — DILTIAZEM HCL ER COATED BEADS 240 MG PO CP24: ORAL_CAPSULE | ORAL | Status: DC

## 2014-10-30 MED ORDER — POLYETHYLENE GLYCOL 3350 17 GM/SCOOP PO POWD: ORAL | Status: DC

## 2014-10-30 MED ORDER — DIGOXIN 125 MCG PO TABS: ORAL_TABLET | ORAL | Status: DC

## 2014-10-30 MED ORDER — MIRTAZAPINE 15 MG PO TABS: 15.0000 mg | ORAL_TABLET | Freq: Every day | ORAL | Status: DC

## 2014-10-30 NOTE — Progress Notes (Signed)
Patient ID: [name], female   DOB: 1914-11-11, 81 y.o.   MRN: [id number]     Patient Active Problem List   Diagnosis Date Noted  . OA (osteoarthritis) of knee 09/11/2014  . Bilateral hearing loss due to cerumen impaction 06/23/2014  . Depression (emotion) 01/01/2014  . Failure to thrive in adult 12/03/2013  . Chronic right hip pain 05/05/2013  . Adnexal mass 05/05/2013  . Vitamin D deficiency 02/11/2013  . Constipation 04/09/2012  . Chronic atrial fibrillation 07/26/2011  . Congestive heart failure with left ventricular systolic dysfunction 78/24/2353  . Hypertension   . GERD (gastroesophageal reflux disease)   . Heart murmur   . Hyperlipidemia   . Diverticulosis of colon     Subjective:  CC:   Chief Complaint  Patient presents with  . Acute Visit    Generalized not feeling well.    HPI:   [name] is a 81 y.o. female who presents for 6 month check up on chronic conditions including hypertension, atrial fibrillation and chronic pain secondary to DJD and arthritis.  Patient's family reports that she complains of feeling poorly a lot. Has recurrent loose stools but not daily.  Has lost 3 lbs since last visit .  No recent ER trips or falls.    Past Medical History  Diagnosis Date  . Incontinence of urine     bladder tac, collagen injections (Dr. Yves Dill)  . Diverticulosis     with bleeding  . Atrial fibrillation   . Arthritis     secondary to OA and DJD  . Hypertension   . GERD (gastroesophageal reflux disease)   . Heart murmur   . Hyperlipidemia   . CHF (congestive heart failure)     EF 35-35% by y last ECHO  . Diverticulosis of colon     Past Surgical History  Procedure Laterality Date  . Cholecystectomy    . Laminectomy    . Cesarean section    . Shoulder hemi-arthroplasty  2010    right shoulder intramedullar rod and screw  . Spinal cord decompression      laminectomy  . Joint replacement      bilateral knee       The following  portions of the patient's history were reviewed and updated as appropriate: Allergies, current medications, and problem list.    Review of Systems:   Patient denies headache, fevers, malaise, unintentional weight loss, skin rash, eye pain, sinus congestion and sinus pain, sore throat, dysphagia,  hemoptysis , cough, dyspnea, wheezing, chest pain, palpitations, orthopnea, edema, abdominal pain, nausea, melena, diarrhea, constipation, flank pain, dysuria, hematuria, urinary  Frequency, nocturia, numbness, tingling, seizures,  Focal weakness, Loss of consciousness,  Tremor, insomnia, depression, anxiety, and suicidal ideation.     History   Social History  . Marital Status: Widowed    Spouse Name: N/A    Number of Children: N/A  . Years of Education: N/A   Occupational History  . Not on file.   Social History Main Topics  . Smoking status: Never Smoker   . Smokeless tobacco: Never Used  . Alcohol Use: No  . Drug Use: No  . Sexual Activity: No   Other Topics Concern  . Not on file   Social History Narrative    Objective:  Filed Vitals:   10/30/14 1431  BP: 120/58  Pulse: 71  Temp: 97.4 F (36.3 C)  Resp: 14     General appearance: alert, cooperative and appears stated age  Ears: normal TM's and external ear canals both ears Throat: lips, mucosa, and tongue normal; teeth and gums normal Neck: no adenopathy, no carotid bruit, supple, symmetrical, trachea midline and thyroid not enlarged, symmetric, no tenderness/mass/nodules Back: symmetric, no curvature. ROM normal. No CVA tenderness. Lungs: clear to auscultation bilaterally Heart: regular rate and rhythm, S1, S2 normal, no murmur, click, rub or gallop Abdomen: soft, non-tender; bowel sounds normal; no masses,  no organomegaly Pulses: 2+ and symmetric Skin: Skin color, texture, turgor normal. No rashes or lesions Lymph nodes: Cervical, supraclavicular, and axillary nodes normal.  Assessment and Plan:  Congestive  heart failure with left ventricular systolic dysfunction She has no signs of acute decompensation.  Checking digoxin level and electrolytes today  Lab Results  Component Value Date   DIGOXIN 0.8 10/30/2014     Chronic atrial fibrillation Rate controlled on current meds,  No changes today    OA (osteoarthritis) of knee Managed with Norco 5-325 mg twice daily .  This is a continuation of pain management from the chronic hip pain.     Vitamin D deficiency Secondary to sedentary lifestyle.  Will continue weekly Drisdol indefinitely unless D level is elevated.      Adnexal mass No further workup ,  Given her extreme age.  She is asymptomatic.    Hypertension Well controlled on current regimen. Renal function stable, no changes today.  Lab Results  Component Value Date   CREATININE 0.78 10/30/2014   Lab Results  Component Value Date   NA 139 10/30/2014   K 5.1 10/30/2014   CL 102 10/30/2014   CO2 31 10/30/2014       Updated Medication List Outpatient Encounter Prescriptions as of 10/30/2014  Medication Sig  . Calcium Carbonate-Vitamin D 600-200 MG-UNIT TABS 1 tablet daily with a meal  . digoxin (LANOXIN) 0.125 MG tablet TAKE 1/2 TABLET BY MOUTH EACH MORNING FOR HEART  . diltiazem (CARDIZEM CD) 240 MG 24 hr capsule TAKE ONE CAPSULE BY MOUTH ONCE DAILY FORHEART AND BLOOD PRESSURE (HIGH)  . furosemide (LASIX) 20 MG tablet TAKE 1 TABLET BY MOUTH ONCE DAILY FOR EDEMA  . HYDROcodone-acetaminophen (NORCO/VICODIN) 5-325 MG per tablet Take one tablet by mouth at bedtime if needed for severe pain.  90 day supply  . losartan (COZAAR) 50 MG tablet TAKE 1 TABLET ONCE DAILY FOR HIGH BLOOD PRESSURE  . MAPAP 500 MG tablet TAKE 2 TABLETS (1072m) BY MOUTH EACH MORNING AFTER BREAKFAST.  . mirtazapine (REMERON) 15 MG tablet Take 1 tablet (15 mg total) by mouth at bedtime. Increase to 2 tablets after 3 weeks  . omeprazole (PRILOSEC) 40 MG capsule Take 1 capsule (40 mg total) by mouth  daily.  . polyethylene glycol powder (GLYCOLAX/MIRALAX) powder 17 gm inn 8 ounces of water daiily for one week, then as needed for constipation  . potassium chloride (K-DUR) 10 MEQ tablet TAKE 1 TABLET BY MOUTH DAILY WITH FOOD AND FLUIDS, FOR SUPPLEMENT.   90 day supply  . Vitamin D, Ergocalciferol, (DRISDOL) 50000 UNITS CAPS capsule TAKE 1 CAPSULE BY MOUTH ONCE WEEKLY FOR SUPPLEMENT  . [DISCONTINUED] Calcium Carbonate-Vitamin D 600-200 MG-UNIT TABS Take 1 tablet by mouth 2 (two) times daily.  . [DISCONTINUED] digoxin (LANOXIN) 0.125 MG tablet TAKE 1/2 TABLET BY MOUTH EACH MORNING FOR HEART  . [DISCONTINUED] diltiazem (CARDIZEM CD) 240 MG 24 hr capsule TAKE ONE CAPSULE BY MOUTH ONCE DAILY FORHEART AND BLOOD PRESSURE (HIGH)  . [DISCONTINUED] HYDROcodone-acetaminophen (NORCO/VICODIN) 5-325 MG per tablet Take one tablet by mouth  at bedtime. May repeat once if needed.  . [DISCONTINUED] losartan (COZAAR) 50 MG tablet TAKE 1 TABLET ONCE DAILY FOR HIGH BLOOD PRESSURE  . [DISCONTINUED] mirtazapine (REMERON) 7.5 MG tablet Take 1 tablet (7.5 mg total) by mouth at bedtime. Increase to 2 tablets after 3 weeks  . [DISCONTINUED] omeprazole (PRILOSEC) 40 MG capsule TAKE ONE CAPSULE BY MOUTH ONCE DAILY FOR GERD  . [DISCONTINUED] polyethylene glycol powder (GLYCOLAX/MIRALAX) powder 17 gm inn 8 ounces of water daiily for one week, then as needed for constipation  . [DISCONTINUED] potassium chloride (K-DUR) 10 MEQ tablet TAKE 1 TABLET BY MOUTH DAILY WITH FOOD AND FLUIDS, FOR SUPPLEMENT  . [DISCONTINUED] Vitamin D, Ergocalciferol, (DRISDOL) 50000 UNITS CAPS capsule TAKE 1 CAPSULE BY MOUTH ONCE WEEKLY FOR SUPPLEMENT  . fentaNYL (DURAGESIC - DOSED MCG/HR) 12 MCG/HR Place 12.5 mcg onto the skin every 3 (three) days.     Orders Placed This Encounter  Procedures  . Pneumococcal polysaccharide vaccine 23-valent greater than or equal to 2yo subcutaneous/IM  . Comp Met (CMET)  . Digoxin level  . TSH  . CBC with  Differential  . Vit D  25 hydroxy (rtn osteoporosis monitoring)    Return in about 6 months (around 04/30/2015).

## 2014-10-30 NOTE — Patient Instructions (Signed)
You ARE DOING WELL.  Continue current medications and refills for 90 days of each have been written

## 2014-10-30 NOTE — Progress Notes (Signed)
Pre-visit discussion using our clinic review tool. No additional management support is needed unless otherwise documented below in the visit note.  

## 2014-10-31 LAB — CBC WITH DIFFERENTIAL/PLATELET
BASOS ABS: 0 10*3/uL (ref 0.0–0.1)
Basophils Relative: 0.2 % (ref 0.0–3.0)
Eosinophils Absolute: 0.1 10*3/uL (ref 0.0–0.7)
Eosinophils Relative: 1.3 % (ref 0.0–5.0)
HCT: 36.6 % (ref 36.0–46.0)
Hemoglobin: 12.5 g/dL (ref 12.0–15.0)
LYMPHS ABS: 1.2 10*3/uL (ref 0.7–4.0)
Lymphocytes Relative: 17.8 % (ref 12.0–46.0)
MCHC: 34.1 g/dL (ref 30.0–36.0)
MCV: 92.8 fl (ref 78.0–100.0)
MONO ABS: 0.5 10*3/uL (ref 0.1–1.0)
MONOS PCT: 6.9 % (ref 3.0–12.0)
Neutro Abs: 5 10*3/uL (ref 1.4–7.7)
Neutrophils Relative %: 73.8 % (ref 43.0–77.0)
Platelets: 230 10*3/uL (ref 150.0–400.0)
RBC: 3.95 Mil/uL (ref 3.87–5.11)
RDW: 13.8 % (ref 11.5–15.5)
WBC: 6.8 10*3/uL (ref 4.0–10.5)

## 2014-10-31 LAB — COMPREHENSIVE METABOLIC PANEL
ALK PHOS: 68 U/L (ref 39–117)
ALT: 6 U/L (ref 0–35)
AST: 14 U/L (ref 0–37)
Albumin: 4.1 g/dL (ref 3.5–5.2)
BUN: 15 mg/dL (ref 6–23)
CO2: 31 mEq/L (ref 19–32)
CREATININE: 0.78 mg/dL (ref 0.40–1.20)
Calcium: 9.3 mg/dL (ref 8.4–10.5)
Chloride: 102 mEq/L (ref 96–112)
GFR: 72.62 mL/min (ref >60.00)
Glucose, Bld: 105 mg/dL — ABNORMAL HIGH (ref 70–99)
Potassium: 5.1 mEq/L (ref 3.5–5.1)
Sodium: 139 mEq/L (ref 135–145)
TOTAL PROTEIN: 6.7 g/dL (ref 6.0–8.3)
Total Bilirubin: 0.4 mg/dL (ref 0.2–1.2)

## 2014-10-31 LAB — VITAMIN D 25 HYDROXY (VIT D DEFICIENCY, FRACTURES): VITD: 62.22 ng/mL (ref 30.00–100.00)

## 2014-10-31 LAB — TSH: TSH: 1.35 u[IU]/mL (ref 0.35–4.50)

## 2014-10-31 LAB — DIGOXIN LEVEL: Digoxin Level: 0.8 ng/mL (ref 0.8–2.0)

## 2014-11-02 NOTE — Assessment & Plan Note (Signed)
Well controlled on current regimen. Renal function stable, no changes today.  Lab Results  Component Value Date   CREATININE 0.78 10/30/2014   Lab Results  Component Value Date   NA 139 10/30/2014   K 5.1 10/30/2014   CL 102 10/30/2014   CO2 31 10/30/2014

## 2014-11-02 NOTE — Assessment & Plan Note (Signed)
Secondary to sedentary lifestyle.  Will continue weekly Drisdol indefinitely unless D level is elevated.

## 2014-11-02 NOTE — Assessment & Plan Note (Signed)
Managed with Norco 5-325 mg twice daily .  This is a continuation of pain management from the chronic hip pain.

## 2014-11-02 NOTE — Assessment & Plan Note (Signed)
Rate controlled on current meds,  No changes today

## 2014-11-02 NOTE — Assessment & Plan Note (Signed)
She has no signs of acute decompensation.  Checking digoxin level and electrolytes today  Lab Results  Component Value Date   DIGOXIN 0.8 10/30/2014

## 2014-11-02 NOTE — Assessment & Plan Note (Signed)
No further workup ,  Given her extreme age.  She is asymptomatic.

## 2014-11-03 ENCOUNTER — Other Ambulatory Visit: Payer: Self-pay | Admitting: Internal Medicine

## 2014-11-05 ENCOUNTER — Encounter: Payer: Self-pay | Admitting: *Deleted

## 2014-12-19 ENCOUNTER — Ambulatory Visit: Payer: Commercial Managed Care - HMO | Admitting: Internal Medicine

## 2015-01-07 ENCOUNTER — Other Ambulatory Visit: Payer: Self-pay | Admitting: Internal Medicine

## 2015-01-11 ENCOUNTER — Ambulatory Visit (INDEPENDENT_AMBULATORY_CARE_PROVIDER_SITE_OTHER): Payer: Commercial Managed Care - HMO | Admitting: Internal Medicine

## 2015-01-11 ENCOUNTER — Encounter: Payer: Self-pay | Admitting: Internal Medicine

## 2015-01-11 VITALS — BP 122/60 | HR 67 | Temp 97.8°F | Resp 16 | Wt 113.2 lb

## 2015-01-11 DIAGNOSIS — F329 Major depressive disorder, single episode, unspecified: Secondary | ICD-10-CM | POA: Diagnosis not present

## 2015-01-11 DIAGNOSIS — F32A Depression, unspecified: Secondary | ICD-10-CM

## 2015-01-11 MED ORDER — ESCITALOPRAM OXALATE 10 MG PO TABS
10.0000 mg | ORAL_TABLET | Freq: Every day | ORAL | Status: DC
Start: 2015-01-11 — End: 2015-03-06

## 2015-01-11 MED ORDER — HYDROCODONE-ACETAMINOPHEN 5-325 MG PO TABS
ORAL_TABLET | ORAL | Status: DC
Start: 2015-01-11 — End: 2015-03-11

## 2015-01-11 MED ORDER — ESCITALOPRAM OXALATE 10 MG PO TABS: 10.0000 mg | ORAL_TABLET | Freq: Every day | ORAL | Status: DC

## 2015-01-11 NOTE — Patient Instructions (Signed)
I am adding Lexapro to help resolve your depression  I hope you start feeling better!  [name] is doing fine!  I will see you again in 3 months

## 2015-01-11 NOTE — Progress Notes (Signed)
Patient ID: [name], female   DOB: [birth year], 81 y.o.   MRN:   Patient Active Problem List   Diagnosis Date Noted  . OA (osteoarthritis) of knee 09/11/2014  . Bilateral hearing loss due to cerumen impaction 06/23/2014  . Depression (emotion) 01/01/2014  . Failure to thrive in adult 12/03/2013  . Chronic right hip pain 05/05/2013  . Adnexal mass 05/05/2013  . Vitamin D deficiency 02/11/2013  . Constipation 04/09/2012  . Chronic atrial fibrillation 07/26/2011  . Congestive heart failure with left ventricular systolic dysfunction 07/26/2011  . Hypertension   . GERD (gastroesophageal reflux disease)   . Heart murmur   . Hyperlipidemia   . Diverticulosis of colon     Subjective:  CC:   Chief Complaint  Patient presents with  . Follow-up    Medication patient crying afraid she is going to be left alone concerned daughter is sick.    HPI:   [name] is a 81 y.o. female who presents for  Worsening depression.  Patient is accompanied by daughter Galen Daft,  Who states that she has been eating less ,  Crying and worrying a lot. Patient denies being in pain and being depressed.    I'm in the way. I'm ready to go, but  The Lord won't take me ." Patient has been telling multiple family members that janette is dying of lung cancer, and it appears that she appears to feel that her continued living is a "burden" to her daughter.  Daughter disagrees, but is quick to point out that she is paying for her mother's A/L facility care and has no familial support now that her brother [name] has died.    Past Medical History  Diagnosis Date  . Incontinence of urine     bladder tac, collagen injections (Dr. Evelene Croon)  . Diverticulosis     with bleeding  . Atrial fibrillation   . Arthritis     secondary to OA and DJD  . Hypertension   . GERD (gastroesophageal reflux disease)   . Heart murmur   . Hyperlipidemia   . CHF (congestive heart failure)     EF 35-35% by y last ECHO  .  Diverticulosis of colon     Past Surgical History  Procedure Laterality Date  . Cholecystectomy    . Laminectomy    . Cesarean section    . Shoulder hemi-arthroplasty  2010    right shoulder intramedullar rod and screw  . Spinal cord decompression      laminectomy  . Joint replacement      bilateral knee       The following portions of the patient's history were reviewed and updated as appropriate: Allergies, current medications, and problem list.    Review of Systems:   Patient denies headache, fevers, malaise, unintentional weight loss, skin rash, eye pain, sinus congestion and sinus pain, sore throat, dysphagia,  hemoptysis , cough, dyspnea, wheezing, chest pain, palpitations, orthopnea, edema, abdominal pain, nausea, melena, diarrhea, constipation, flank pain, dysuria, hematuria, urinary  Frequency, nocturia, numbness, tingling, seizures,  Focal weakness, Loss of consciousness,  Tremor, insomnia, depression, anxiety, and suicidal ideation.     History   Social History  . Marital Status: Widowed    Spouse Name: N/A  . Number of Children: N/A  . Years of Education: N/A   Occupational History  . Not on file.   Social History Main Topics  . Smoking status: Never Smoker   . Smokeless tobacco: Never Used  .  Alcohol Use: No  . Drug Use: No  . Sexual Activity: No   Other Topics Concern  . Not on file   Social History Narrative    Objective:  Filed Vitals:   01/11/15 1432  BP: 122/60  Pulse: 67  Temp: 97.8 F (36.6 C)  Resp: 16     General appearance: alert, cooperative and appears stated age. Seated in wheelchair,   Ears: normal TM's and external ear canals both ears Throat: lips, mucosa, and tongue normal; teeth and gums normal Neck: no adenopathy, no carotid bruit, supple, symmetrical, trachea midline and thyroid not enlarged, symmetric, no tenderness/mass/nodules Back: symmetric, no curvature. ROM normal. No CVA tenderness. Lungs: clear to  auscultation bilaterally Heart: regular rate and rhythm, S1, S2 normal, no murmur, click, rub or gallop Abdomen: soft, non-tender; bowel sounds normal; no masses,  no organomegaly Pulses: 2+ and symmetric Skin: Skin color, texture, turgor normal. No rashes or lesions Lymph nodes: Cervical, supraclavicular, and axillary nodes normal. Psych: affect  Sad, dejected, makes eye contact. No fidgeting,  .  Denies suicidal thoughts    Assessment and Plan:  Depression (emotion) Her weight is stable.  She is not having trouble sleeping.  Adding lexapro today for negative symptoms .  Continue remeron    A total of 25 minutes of face to face time was spent with patient and daughter more than half of which was spent in counselling and coordination of care   Updated Medication List Outpatient Encounter Prescriptions as of 01/11/2015  Medication Sig  . Calcium Carbonate-Vitamin D 600-200 MG-UNIT TABS 1 tablet daily with a meal  . digoxin (LANOXIN) 0.125 MG tablet TAKE 1/2 TABLET BY MOUTH EACH MORNING FOR HEART  . diltiazem (DILTIAZEM CD) 240 MG 24 hr capsule Take 1 capsule (240 mg total) by mouth daily. PLEASE CALL OFFICE ASAP FOR APPT FOR BP F/U FOR ADDITIONAL REFILLS  . furosemide (LASIX) 20 MG tablet TAKE 1 TABLET BY MOUTH ONCE DAILY FOR EDEMA  . losartan (COZAAR) 50 MG tablet TAKE 1 TABLET ONCE DAILY FOR HIGH BLOOD PRESSURE  . MAPAP 500 MG tablet TAKE 2 TABLETS (1000mg ) BY MOUTH EACH MORNING AFTER BREAKFAST.  . mirtazapine (REMERON) 15 MG tablet Take 1 tablet (15 mg total) by mouth at bedtime. Increase to 2 tablets after 3 weeks  . omeprazole (PRILOSEC) 40 MG capsule Take 1 capsule (40 mg total) by mouth daily.  . polyethylene glycol powder (GLYCOLAX/MIRALAX) powder 17 gm inn 8 ounces of water daiily for one week, then as needed for constipation  . Vitamin D, Ergocalciferol, (DRISDOL) 50000 UNITS CAPS capsule TAKE 1 CAPSULE BY MOUTH ONCE WEEKLY FOR SUPPLEMENT  . [DISCONTINUED]  HYDROcodone-acetaminophen (NORCO/VICODIN) 5-325 MG per tablet Take one tablet by mouth at bedtime if needed for severe pain.  90 day supply  . escitalopram (LEXAPRO) 10 MG tablet Take 1 tablet (10 mg total) by mouth daily.  . fentaNYL (DURAGESIC - DOSED MCG/HR) 12 MCG/HR Place 12.5 mcg onto the skin every 3 (three) days.  Marland Kitchen. HYDROcodone-acetaminophen (NORCO/VICODIN) 5-325 MG per tablet Take one tablet by mouth at bedtime if needed for severe pain.  90 day supply  . [DISCONTINUED] escitalopram (LEXAPRO) 10 MG tablet Take 1 tablet (10 mg total) by mouth daily.     No orders of the defined types were placed in this encounter.    No Follow-up on file.

## 2015-01-13 ENCOUNTER — Encounter: Payer: Self-pay | Admitting: Internal Medicine

## 2015-01-13 NOTE — Assessment & Plan Note (Addendum)
Adding lexapro today for negative symptoms .  Continue remeron

## 2015-01-21 ENCOUNTER — Other Ambulatory Visit: Payer: Self-pay | Admitting: Internal Medicine

## 2015-02-01 NOTE — Consult Note (Signed)
PATIENT NAME:  [name], [name]:  [id number] DATE OF BIRTH:  01-Oct-1915  DATE OF CONSULTATION:  08/21/2013  CONSULTING PHYSICIAN:  Kathreen DevoidKevin L. Shoua Ulloa, MD  REASON FOR CONSULTATION: Right low back, hip and lower extremity pain.   HISTORY OF PRESENT ILLNESS: The patient is a 81 year old female who lives in a nursing facility. The patient is not an accurate historian but there were multiple family members at her  bedside at the time of the consultation. The patient has been having increasing pain over the past 6 to 8 weeks involving the right lower back, hip, and lower extremity. The family reports that she has been [facility] and specifically Marcelline Matesodd Munday, PA-C.  The plan at the last office visit was for the patient to get an MRI, but up until now, they have not had this test completed. The patient claims the pain extends from the low back through the hip and down the lower extremities, at times into the lower leg and foot. She denies any significant weakness or numbness. The patient has had a prior hip fracture which was fixed by Dr. Deeann SaintHoward Miller, although the family had thought it was Dr. Gerrit Heckaliff. The patient has had no additional falls or trauma recently.   PAST MEDICAL HISTORY: Includes hypertension, iron deficiency anemia, urinary incontinence, and history of chronic Afib, dilated cardiomyopathy, history of chronic systolic CHF, and ejection fraction of 35% to 40%.   PAST SURGICAL HISTORY: Includes right intramedullary nail fixation for an intertrochanteric hip fracture estimated approximately 5 years ago by the family, bilateral total knee arthroplasties, cholecystectomy, and appendectomy.   ALLERGIES: SULFA AND ASPIRIN.   SOCIAL HISTORY: The patient denies alcohol or tobacco use. She walks at baseline with a walker.   PHYSICAL EXAMINATION:  GENERAL: Examination at the bedside showed the patient was uncomfortable. She had minimal tenderness to palpation around the  hip and lower back on the right side. She had minimal discomfort with logrolling.  NEUROLOGIC: Intact motor of function of all muscles to the right lower extremity as well as intact sensation to light touch and palpable pedal pulses.   RADIOLOGY: X-ray films taken of the right hip showed a previous short IM rod in place. The intratrochanteric hip fracture appears well healed. There is no deformity to the proximal femur and there is no evidence of AVN or femoral head collapse. There were degenerative changes, which are mild to moderate, within the right hip. The lumbar spine films show extensive degenerative changes at multiple levels with an L3 compression fracture that appears chronic.   A CT of the abdomen and pelvis was also performed which confirmed the above-noted findings.   ASSESSMENT: Low back pain and right-sided pain and radicular symptoms concerning for herniated disk or chronic degenerative disease.   PLAN: I suspect Ms. [name]' pain is coming not from her hip directly but from her low back. I am going to order an MRI to further evaluate her lumbar spine and assess the severity of her degenerative disk disease. We will also be able to tell the chronicity of the L3 compression fracture and look for a herniated disk.   I will continue to follow the patient and will follow up on the MRI once this test has been completed.    ____________________________ Kathreen DevoidKevin L. Crews Mccollam, MD klk:np D: 08/21/2013 16:42:24 ET T: 08/21/2013 17:04:02 ET JOB#: [id number]  cc: Kathreen DevoidKevin L. Airen Dales, MD, <Dictator> Kathreen DevoidKEVIN L Meliss Fleek MD ELECTRONICALLY SIGNED 09/04/2013 19:25

## 2015-02-01 NOTE — H&P (Signed)
PATIENT NAME:  [name], Jennifer J MR#:  [id number] DATE OF BIRTH:  1915/04/14  DATE OF ADMISSION:  08/19/2013  REFERRING PHYSICIAN:  Dr. Margarita GrizzleWoodruff  PRIMARY CARE PHYSICIAN: Dr. Darrick Huntsmanullo   CHIEF COMPLAINT: Right hip pain.   HISTORY OF PRESENT ILLNESS: The patient is a pleasant, 81 year old female, who lives in a care home and is accompanied by her family. The patient is a rather poor historian but, per family, the patient has been having about six weeks' worth of pain in the right hip, which has interfered with her mobility. The patient usually walks with a walker, but recently has had decreased mobility as well. Furthermore, in the last couple of months, patient has had extensive weight loss, and this has not been worked up. She weighed about 135 pounds or so a few months ago, and currently she was noted to have 91 pounds of total body weight. She has been having some increased frequency of urination as well as incontinence. The incontinence is chronic, but increased frequency is more recent. The patient has received several doses of pain medications. A CAT scan of abdomen and pelvis was done, which commented on the pain at the hip and also some chronic back pain. There is no mention of a hip fracture. There is possible chronic mild compression fracture of L3. Hospitalist services were contacted for further evaluation and management.   PAST MEDICAL HISTORY: Hypertension, iron deficiency anemia, history of urinary incontinence, history of chronic AFib, history of dilated cardiomyopathy, history of chronic systolic CHF, last EF of 35% to 40%, per chart.   SURGICAL HISTORY: Right hip nail placements, bilateral knee surgery, cholecystectomy, appendectomy.   ALLERGIES: ASPIRIN and SULFA.   FAMILY HISTORY: Both parents with heart disease.   SOCIAL HISTORY: Lives in a care home. Denies alcohol or drug use. Walks with a walker.   OUTPATIENT MEDICATIONS: Acetaminophen 325 mg p.r.n., acetaminophen/hydrocodone  325/5 mg 1 tab once a day at bedtime for pain, calcium plus vitamin D 1 tab 2 times a day, digoxin 125 mcg daily, diltiazem 240 mg extended release daily, furosemide 20 mg daily,  guaifenesin p.r.n., loperamide p.r.n., losartan 50 mg daily, milk of magnesia p.r.n., Mylanta p.r.n., omeprazole 40 mg daily, Pepto-Bismol p.r.n., potassium chloride 10 mEq extended-release once a day, vitamin D2, 50,000 units on Thursdays.   REVIEW OF SYSTEMS:   GENERAL:  The patient appears to have mild to moderate dementia. She is not a good or reliable historian but, per family, patient has had no fevers or fatigue, is not eating, and patient stated that she just does not feel hungry, and has poor appetite.  EYES: Denies blurry vision.  EARS, NOSE, THROAT: Has decreased hearing.  RESPIRATORY: Denies cough, wheezing, or shortness of breath.  GASTROINTESTINAL: Has off-and-on diarrhea. Denies abdominal pain, bloody stools.  CARDIOVASCULAR: Denies chest pain, swelling in the legs.  GENITOURINARY: Has chronic incontinence. Has some increased frequency, without dysuria. HEMATOLOGIC AND LYMPHATIC: Has a history of iron deficiency anemia.  SKIN: No rashes.  MUSCULOSKELETAL: Has right hip pain and some back pain. NEUROLOGIC:  Denies focal weakness or numbness.  PSYCHIATRIC:  Has dementia.   PHYSICAL EXAMINATION: VITAL SIGNS: Temperature on arrival 97.9, pulse rate 68, respiratory rate 18, blood pressure 131/50, O2 sat 98% on room air.  GENERAL: The patient is an elderly, frail-appearing female, no obvious distress, lying in bed.  HEENT: Normocephalic, atraumatic, with temporal wasting. Pupils are equally reactive. Moist mucous membranes.  NECK: Supple. No thyroid tenderness. No cervical lymphadenopathy.  CARDIOVASCULAR:  S1, S2. Regularly regular. No significant murmurs appreciated.  LUNGS:  Poor effort, mild basilar crackles, but good air entry otherwise.  ABDOMEN:  Soft, nontender, nondistended. Positive bowel sounds in  all quadrants.  EXTREMITIES: No significant lower extremity edema.  NEUROLOGIC: Cranial nerves II through XII grossly intact. Strength is 5/5 upper extremities. Lower extremities on the left is 5/5. On the right, patient cannot move the right lower extremity as much of the left. Range of motion of the hip is limited secondary to elicitation of pain.  SKIN: No obvious rashes.  PSYCHIATRIC: The patient is awake and alert, not oriented to place or time, but is oriented to her family.   LABS AND IMAGING: CT of abdomen and pelvis without contrast shows no nephrolithiasis, hydronephrosis or hydroureter. There are postsurgical changes in the right proximal femur. There are degenerative changes bilateral hip joints, right greater than the left. Probable chronic mild compression deformity of superior endplate of L3. Some diverticula without diverticulitis, moderate stool. BUN 14, creatinine 0.88, sodium 136, potassium 4.2. CBC within normal limits. UA positive, with 1+ leukocyte esterase, 17 WBC, trace bacteria.   ASSESSMENT AND PLAN: We have a 81 year old female from a care house with chronic congestive heart failure. history of chronic atrial fibrillation, and chronic urinary incontinence with poor p.o. intake, failure to thrive for the last couple of months, and hip pain. The patient has received several doses of IV pain medications without significant improvement, and the hospitalists asked for admission. Will admit her for observation. The CAT scan does not show any significant fractures, but pain has persisted and, therefore, we are to admit the patient. Obtain an Orthopedic consult, Physical Therapy consult. Start the patient on some p.r.n., IV and p.o. medications.   In regards to the weight loss, this should be investigated further, but patient stated that she has had very poor p.o. intake, and has lost perhaps about 35 or 40 pounds in the last several months. The patient does have a positive UA and some  signs of a UTI. I would go ahead and start the patient on ceftriaxone and obtain urine cultures. I would go ahead and check a digoxin level, resume her cardiac medications. Her congestive heart failure is chronic. She does not appear to have acute flare, and is appearing to be euvolemic. She does have some constipation, per CAT scan, and also per daughter, but also she has some episodes of diarrhea as well. She does not appear to be impacted. I will put her on a  bowel regimen. The patient is DNR, per family.   Total time spent is 50 minutes.     ____________________________ Krystal Eaton, MD sa:mr D: 08/19/2013 19:16:51 ET T: 08/19/2013 19:51:42 ET JOB#: [id number]  cc: Krystal Eaton, MD, <Dictator> Krystal Eaton MD ELECTRONICALLY SIGNED 08/31/2013 7:06

## 2015-02-01 NOTE — Consult Note (Signed)
Brief Consult Note: Diagnosis: Low back and right hip pain.   Patient was seen by consultant.   Consult note dictated.   Recommend further assessment or treatment.   Orders entered.   Comments: Patient having back and right lower extremity pain for 2 months.  Pain is severe.  Responding to morphine.  Patient NVI with intact motor function in the right lower extremity.  Xrays of right hip show healed previous hip fracture with short IM nail in place.  Lumbar spine films show extensive degenerative changes at multiple levels with a L3 compression fracture that appears chronic.  Will order MRI of lumbar spine for tomorrow to evaluate for herniated disc.  Electronic Signatures: Juanell FairlyKrasinski, Esmae Donathan (MD)  (Signed [id number]-Nov-14 17:41)  Authored: Brief Consult Note   Last Updated: 09-Nov-14 17:41 by Juanell FairlyKrasinski, Cariana Karge (MD)

## 2015-02-01 NOTE — Discharge Summary (Signed)
PATIENT NAME:  [name], [name] DATE OF BIRTH:  01-Jan-1915  DATE OF ADMISSION:  08/19/2013 DATE OF DISCHARGE:  08/24/2013  DISCHARGE DIAGNOSES:  1.  Acute on chronic low back pain secondary to L2-L3 disk protrusion with severe pain.  2.  Escherichia coli urinary tract infection.  3.  Failure to thrive.  4 . Severe malnutrition. 5.  Chronic congestive heart failure.  6.  Chronic atrial fibrillation.   CONSULTANTS:  1.  Dr. Juanell FairlyKevin Krasinski of orthopedics.  2.  Dr. Delano MetzFrancisco Naveira of pain management.   IMAGING STUDIES: Include a CT scan of the abdomen and pelvis without contrast which showed no nephrolithiasis, postsurgical changes of the right proximal femur, chronic mild compression deformity of L3 vertebral body, and diverticulosis.   MRI lumbar spine without contrast showed disk protrusion of L3-L4 of 8.9 to 9.1 cm.   ADMITTING HISTORY AND PHYSICAL AND HOSPITAL COURSE: Please see detailed H and P dictated previously by Dr. Jacques NavyAhmadzia. In brief, a 81 year old female patient with history of atrial fibrillation, CHF, and chronic low back pain, presented to the hospital with worsening chronic low back pain radiating down the right leg. The patient had MRI showing disk protrusion. Dr. Martha ClanKrasinski of orthopedics was consulted who suggested an epidural injection. Dr. Laban EmperorNaveira with pain clinic did the procedure, and the patient is pain-free by the day of discharge. The patient has worked with physical therapy and will be discharged back to her family care home with home health with PT. The patient and family were offered skilled nursing facility, but they thought that the patient would like to return to the family home with home health PT. She did have E. coli UTI for which she has been on ceftriaxone during the hospital stay for 5 days.   Today on examination, the patient's motor strength is 5 out of 5 in lower extremities and sensations are intact.   DISCHARGE MEDICATIONS: Include:   1.  Medrol 8 mg on day 1 and 4 mg on day 2.  2.  Omeprazole 40 mg daily.  3.  Potassium chloride 10 mEq once a day.  4.  Diltiazem 240 mg oral once a day.  5.  Losartan 50 mg daily.  6.  Lasix 20 mg daily.  7.  Calcium plus vitamin D 1 tablet 2 times a day.  8.  Vitamin D2 50,000 international units oral once a week.  9.  Digoxin 125 mcg oral once a day.  10.  Guaifenesin 5 mL oral as needed for cough.  11.  Milk of magnesia 30 mL orally as needed for constipation daily.  12.  Acetaminophen 325 mg 2 tablets every 6 hours as needed for pain and fever.  13.  Loperamide 2 mg oral 4 times a day as needed for diarrhea.  14.  Mylanta 15 mL as needed for indigestion once a day.  15.  Zofran 4 mg oral every 4 hours as needed for nausea and vomiting.  16.  Acetaminophen/hydrocodone 325/5 mg 1 tablet oral 2 times a day as needed for pain.   DISCHARGE INSTRUCTIONS: Low-salt diet. Activity as tolerated with assistance at all times. No heavy lifting. Regular consistency diet. Follow up with primary care physician in 1 to 2 weeks. The patient can follow up with the pain clinic, with Dr. Laban EmperorNaveira, for low back pain p.r.n.   TIME SPENT: On day of discharge in discharge activity was 40 minutes. ____________________________ Molinda BailiffSrikar R. Karole Oo, MD srs:sb D: 08/24/2013 13:46:08 ET T:  08/24/2013 15:23:46 ET JOB#: [id number]  cc: Wardell Heath R. Brain Honeycutt, MD, <Dictator> Orie Fisherman MD ELECTRONICALLY SIGNED 08/28/2013 20:46

## 2015-02-05 ENCOUNTER — Other Ambulatory Visit: Payer: Self-pay | Admitting: Internal Medicine

## 2015-02-25 ENCOUNTER — Other Ambulatory Visit: Payer: Self-pay | Admitting: Internal Medicine

## 2015-03-06 ENCOUNTER — Other Ambulatory Visit: Payer: Self-pay | Admitting: Internal Medicine

## 2015-03-11 ENCOUNTER — Other Ambulatory Visit: Payer: Self-pay | Admitting: Internal Medicine

## 2015-03-11 DIAGNOSIS — M170 Bilateral primary osteoarthritis of knee: Secondary | ICD-10-CM

## 2015-03-11 MED ORDER — HYDROCODONE-ACETAMINOPHEN 5-325 MG PO TABS: ORAL_TABLET | ORAL | Status: DC

## 2015-03-12 NOTE — Progress Notes (Signed)
Facility picking up DME for wheel chair and Norco script. Placed at front desk.

## 2015-03-27 ENCOUNTER — Other Ambulatory Visit: Payer: Self-pay | Admitting: Internal Medicine

## 2015-04-10 ENCOUNTER — Other Ambulatory Visit: Payer: Self-pay | Admitting: Internal Medicine

## 2015-04-10 NOTE — Telephone Encounter (Signed)
Last refill 4.1.16.  Please advise refill 

## 2015-04-12 ENCOUNTER — Ambulatory Visit: Payer: Commercial Managed Care - HMO | Admitting: Internal Medicine

## 2015-04-23 DIAGNOSIS — Z0279 Encounter for issue of other medical certificate: Secondary | ICD-10-CM

## 2015-05-14 ENCOUNTER — Telehealth: Payer: Self-pay | Admitting: *Deleted

## 2015-05-14 MED ORDER — HYDROCODONE-ACETAMINOPHEN 5-325 MG PO TABS: ORAL_TABLET | ORAL | Status: DC

## 2015-05-14 NOTE — Telephone Encounter (Signed)
Spoke with pts daughter, aware rx ready for pick up

## 2015-05-14 NOTE — Telephone Encounter (Signed)
Jim from the pharmacy called requesting refill on Norco Rx.  States #90 was written on 5.30.16 however insurance would only pay for #31.  Please advise refill

## 2015-05-14 NOTE — Telephone Encounter (Signed)
Refill for 30 days printed  

## 2015-06-07 ENCOUNTER — Other Ambulatory Visit: Payer: Self-pay | Admitting: Internal Medicine

## 2015-06-20 ENCOUNTER — Other Ambulatory Visit: Payer: Self-pay | Admitting: Internal Medicine

## 2015-06-20 NOTE — Telephone Encounter (Signed)
Last OV 4.1.16.  Please advise refill 

## 2015-06-20 NOTE — Telephone Encounter (Signed)
Ok to refill,  printed rx  

## 2015-06-21 NOTE — Telephone Encounter (Signed)
Spoke with facility administrator Nolberto Hanlon, advised her Rx ready for pickup.  Verbalized understanding

## 2015-07-23 ENCOUNTER — Other Ambulatory Visit: Payer: Self-pay | Admitting: Internal Medicine

## 2015-08-02 ENCOUNTER — Telehealth: Payer: Self-pay | Admitting: Internal Medicine

## 2015-08-02 MED ORDER — HYDROCODONE-ACETAMINOPHEN 5-325 MG PO TABS: ORAL_TABLET | ORAL | Status: DC

## 2015-08-02 NOTE — Telephone Encounter (Signed)
Notified daughter that Rx is ready for pick up, Rx put in folder

## 2015-08-02 NOTE — Telephone Encounter (Signed)
90 day supply authorized and printed  

## 2015-08-02 NOTE — Telephone Encounter (Signed)
Last OV 01/11/15,  Last refill 06/20/15 for 31 tablets with 0 refills.  Okay to refill?

## 2015-08-02 NOTE — Telephone Encounter (Signed)
Pt daughter called  Stating that her mother needs a refill on HYDROcodone-acetaminophen (NORCO/VICODIN) 5-325 MG per tablet.. Please advise pt daughter when ready..Marland Kitchen

## 2015-08-09 ENCOUNTER — Telehealth: Payer: Self-pay | Admitting: Internal Medicine

## 2015-08-09 NOTE — Telephone Encounter (Signed)
Spoke with son in law he will have patient here for the appointment.

## 2015-08-09 NOTE — Telephone Encounter (Signed)
4:30 on Tuesday . Confirm patient is taking the antidepressant lexapro (escitalopram)

## 2015-08-09 NOTE — Telephone Encounter (Signed)
Please advise 

## 2015-08-09 NOTE — Telephone Encounter (Signed)
Jennifer CornsJeannette called on behalf of her mom regarding her being Depressed and her right leg is hurting her. Daughter states she is concerned about the depression and her crying all the time. Daughter would like her medication checked. No appt avail. Pt only wants to see Dr Darrick Huntsmanullo. Please let me know where to schedule once spoken to Dr Darrick Huntsmanullo. Thank you!

## 2015-08-13 ENCOUNTER — Encounter: Payer: Self-pay | Admitting: Internal Medicine

## 2015-08-13 ENCOUNTER — Ambulatory Visit (INDEPENDENT_AMBULATORY_CARE_PROVIDER_SITE_OTHER): Payer: Commercial Managed Care - HMO | Admitting: Internal Medicine

## 2015-08-13 VITALS — BP 128/58 | HR 81 | Temp 97.9°F

## 2015-08-13 DIAGNOSIS — M1711 Unilateral primary osteoarthritis, right knee: Secondary | ICD-10-CM

## 2015-08-13 DIAGNOSIS — F329 Major depressive disorder, single episode, unspecified: Secondary | ICD-10-CM

## 2015-08-13 MED ORDER — MELOXICAM 7.5 MG PO TABS: 7.5000 mg | ORAL_TABLET | Freq: Every day | ORAL | Status: DC

## 2015-08-13 MED ORDER — MIRTAZAPINE 45 MG PO TABS: 45.0000 mg | ORAL_TABLET | Freq: Every day | ORAL | Status: AC

## 2015-08-13 NOTE — Patient Instructions (Signed)
I am stopping the Vicodin because it may be depressing you  I am adding meloxicam 7.5 mg daily for arthritis pain   I am increasing the mirtazapine to 45 mg to help improve your mood

## 2015-08-13 NOTE — Progress Notes (Signed)
Subjective:  Patient ID: [name], female    DOB: 12/01/1914  Age: 81 y.o. MRN: [id number]  CC: The primary encounter diagnosis was Primary osteoarthritis of right knee. A diagnosis of Major depression, melancholic type [facility]) was also pertinent to this visit.  HPI [name] presents for follow up on chronic issues including depression and OA of right leg.  She is accompanied by her daughter, who is concerned that the vicodin prescribed for her knee pain  Is aggravating her depression.  Patient has been taking lexapro  Since Apirl,  Along with remeron   Patent's chief complaint is right leg pain.  She  is unable to articulate any other issues today. She has a sedentary lifestyle and lives in an assisted living facility.   History of recent  fall while standing up unassisted,  Hit head on table .  No ER visit .  No LOC  Outpatient Prescriptions Prior to Visit  Medication Sig Dispense Refill  . Calcium Carbonate-Vitamin D 600-200 MG-UNIT TABS 1 tablet daily with a meal 90 tablet 3  . Calcium Carbonate-Vitamin D 600-400 MG-UNIT per tablet TAKE 1 TABLET BY MOUTH 2 TIMES A DAY WITH MEALS. 60 tablet 6  . digoxin (LANOXIN) 0.125 MG tablet TAKE 1/2 TABLET BY MOUTH EACH MORNING FOR HEART 15 tablet 6  . diltiazem (CARDIZEM CD) 240 MG 24 hr capsule TAKE ONE CAPSULE BY MOUTH ONCE DAILY FORHEART AND BLOOD PRESSURE (HIGH) 30 capsule 6  . escitalopram (LEXAPRO) 10 MG tablet TAKE 1 TABLET BY MOUTH ONCE DAILY FOR DEPRESSION. 30 tablet 3  . furosemide (LASIX) 20 MG tablet TAKE 1 TABLET BY MOUTH ONCE DAILY FOR EDEMA 90 tablet 0  . losartan (COZAAR) 50 MG tablet TAKE 1 TABLET ONCE DAILY FOR HIGH BLOOD PRESSURE 30 tablet 6  . MAPAP 500 MG tablet TAKE 2 TABLETS ( ) BY MOUTH EACH MORNING AFTER BREAKFAST. 60 tablet 11  . omeprazole (PRILOSEC) 40 MG capsule TAKE ONE CAPSULE BY MOUTH ONCE DAILY FOR GERD 30 capsule 5  . polyethylene glycol powder (GLYCOLAX/MIRALAX) powder 17 gm inn 8 ounces of water  daiily for one week, then as needed for constipation 3350 g 1  . Vitamin D, Ergocalciferol, (DRISDOL) 50000 UNITS CAPS capsule TAKE 1 CAPSULE BY MOUTH ONCE WEEKLY FOR SUPPLEMENT 12 capsule 3  . fentaNYL (DURAGESIC - DOSED MCG/HR) 12 MCG/HR Place 12.5 mcg onto the skin every 3 (three) days.    Marland Kitchen HYDROcodone-acetaminophen (NORCO/VICODIN) 5-325 MG tablet TAKE 1 TABLET BY MOUTH ONCE DAILY AT BEDTIME AS NEEDED FOR SEVERE PAIN 90 tablet 0  . mirtazapine (REMERON) 15 MG tablet Take 1 tablet (15 mg total) by mouth at bedtime. Increase to 2 tablets after 3 weeks 90 tablet 2  . mirtazapine (REMERON) 30 MG tablet TAKE 1 TABLET BY MOUTH ONCE DAILY AT BEDTIME 90 tablet 1   No facility-administered medications prior to visit.    Review of Systems;  Patient denies headache, fevers, malaise, unintentional weight loss, skin rash, eye pain, sinus congestion and sinus pain, sore throat, dysphagia,  hemoptysis , cough, dyspnea, wheezing, chest pain, palpitations, orthopnea, edema, abdominal pain, nausea, melena, diarrhea, constipation, flank pain, dysuria, hematuria, urinary  Frequency, nocturia, numbness, tingling, seizures,  Focal weakness, Loss of consciousness,  Tremor, insomnia, depression, anxiety, and suicidal ideation.      Objective:  BP 128/58 mmHg  Pulse 81  Temp(Src) 97.9 F (36.6 C) (Oral)  Wt   SpO2 97%  BP Readings from Last 3 Encounters:  08/13/15  128/58  01/11/15 122/60  10/30/14 120/58    Wt Readings from Last 3 Encounters:  01/11/15 113 lb 4 oz (51.37 kg)  10/30/14 108 lb (48.988 kg)  11/30/13 110 lb 8 oz (50.122 kg)    General appearance: alert, cooperative and appears stated age Ears: normal TM's and external ear canals both ears Throat: lips, mucosa, and tongue normal; teeth and gums normal Neck: no adenopathy, no carotid bruit, supple, symmetrical, trachea midline and thyroid not enlarged, symmetric, no tenderness/mass/nodules Back: symmetric, no curvature. ROM normal. No  CVA tenderness. Lungs: clear to auscultation bilaterally Heart: regular rate and rhythm, S1, S2 normal, no murmur, click, rub or gallop Abdomen: soft, non-tender; bowel sounds normal; no masses,  no organomegaly Pulses: 2+ and symmetric Skin: Skin color, texture, turgor normal. No rashes or lesions Lymph nodes: Cervical, supraclavicular, and axillary nodes normal.  No results found for: HGBA1C  Lab Results  Component Value Date   CREATININE 0.78 10/30/2014   CREATININE 0.78 02/11/2014   CREATININE 0.83 11/30/2013    Lab Results  Component Value Date   WBC 6.8 10/30/2014   HGB 12.5 10/30/2014   HCT 36.6 10/30/2014   PLT 230.0 10/30/2014   GLUCOSE 105* 10/30/2014   ALT 6 10/30/2014   AST 14 10/30/2014   NA 139 10/30/2014   K 5.1 10/30/2014   CL 102 10/30/2014   CREATININE 0.78 10/30/2014   BUN 15 10/30/2014   CO2 31 10/30/2014   TSH 1.35 10/30/2014   INR 1.0 02/11/2014    No results found.  Assessment & Plan:   Problem List Items Addressed This Visit    Major depression, melancholic type (HCC)    Continue lexapro 10 mg daily,  Increase remeron to 45 mg      Relevant Medications   mirtazapine (REMERON) 45 MG tablet   OA (osteoarthritis) of knee - Primary    Stopping vicodin due to patient's worsening of depression.  Adding meloxicam 7.5 mg daily , continue tylenol      Relevant Medications   meloxicam (MOBIC) 7.5 MG tablet     A total of 25 minutes of face to face time was spent with patient more than half of which was spent in counselling about the above mentioned conditions  and coordination of care    I have discontinued Ms. Barnett ApplebaumJones's fentaNYL, mirtazapine, and HYDROcodone-acetaminophen. I have also changed her mirtazapine. Additionally, I am having her start on meloxicam. Lastly, I am having her maintain her Calcium Carbonate-Vitamin D, Vitamin D (Ergocalciferol), polyethylene glycol powder, losartan, diltiazem, digoxin, omeprazole, furosemide, MAPAP,  escitalopram, and Calcium Carbonate-Vitamin D.  Meds ordered this encounter  Medications  . mirtazapine (REMERON) 45 MG tablet    Sig: Take 1 tablet (45 mg total) by mouth at bedtime.    Dispense:  90 tablet    Refill:  1  . meloxicam (MOBIC) 7.5 MG tablet    Sig: Take 1 tablet (7.5 mg total) by mouth daily.    Dispense:  90 tablet    Refill:  1    Medications Discontinued During This Encounter  Medication Reason  . fentaNYL (DURAGESIC - DOSED MCG/HR) 12 MCG/HR   . mirtazapine (REMERON) 15 MG tablet   . mirtazapine (REMERON) 30 MG tablet Reorder  . HYDROcodone-acetaminophen (NORCO/VICODIN) 5-325 MG tablet     Follow-up: No Follow-up on file.   Sherlene ShamsULLO, Reyan Helle L, MD

## 2015-08-15 NOTE — Assessment & Plan Note (Signed)
Stopping vicodin due to patient's worsening of depression.  Adding meloxicam 7.5 mg daily , continue tylenol

## 2015-08-15 NOTE — Assessment & Plan Note (Signed)
Continue lexapro 10 mg daily,  Increase remeron to 45 mg

## 2015-08-20 ENCOUNTER — Other Ambulatory Visit: Payer: Self-pay | Admitting: Internal Medicine

## 2015-09-07 ENCOUNTER — Other Ambulatory Visit: Payer: Self-pay | Admitting: Internal Medicine

## 2015-09-16 ENCOUNTER — Other Ambulatory Visit: Payer: Self-pay | Admitting: Internal Medicine

## 2015-09-23 ENCOUNTER — Other Ambulatory Visit: Payer: Self-pay | Admitting: Internal Medicine

## 2015-09-23 NOTE — Telephone Encounter (Signed)
OK to fill

## 2015-09-24 NOTE — Telephone Encounter (Signed)
Yes, done.  

## 2015-10-02 ENCOUNTER — Telehealth: Payer: Self-pay | Admitting: Internal Medicine

## 2015-10-02 NOTE — Telephone Encounter (Signed)
Jennifer BabinskiMarilyn [phone] called regarding pt was complaining of being dizzy and could not walk. Blood sugar was 155 and pt was having a hard time breathing. Medic is there and did not feel pt needed to go to the hospital. So she wanted to call and give update on pt. Thank You!

## 2015-10-02 NOTE — Telephone Encounter (Signed)
Spoke with [name].  They believe she was having a anxiety attack at the time, she is currently fine, no issues.  Paramedics came out and assessed her and patient was not transported.

## 2015-10-04 ENCOUNTER — Inpatient Hospital Stay (HOSPITAL_COMMUNITY)
Admit: 2015-10-04 | Discharge: 2015-10-04 | Disposition: A | Discharge status: Home / Self Care | Payer: Commercial Managed Care - HMO | Attending: Internal Medicine | Admitting: Internal Medicine

## 2015-10-04 ENCOUNTER — Emergency Department: Payer: Commercial Managed Care - HMO

## 2015-10-04 ENCOUNTER — Inpatient Hospital Stay
Admission: EM | Admit: 2015-10-04 | Discharge: 2015-10-08 | DRG: 689 | Payer: Commercial Managed Care - HMO | Attending: Internal Medicine | Admitting: Internal Medicine

## 2015-10-04 DIAGNOSIS — H9190 Unspecified hearing loss, unspecified ear: Secondary | ICD-10-CM | POA: Diagnosis present

## 2015-10-04 DIAGNOSIS — Z96653 Presence of artificial knee joint, bilateral: Secondary | ICD-10-CM | POA: Diagnosis present

## 2015-10-04 DIAGNOSIS — I248 Other forms of acute ischemic heart disease: Secondary | ICD-10-CM | POA: Diagnosis present

## 2015-10-04 DIAGNOSIS — I10 Essential (primary) hypertension: Secondary | ICD-10-CM | POA: Diagnosis present

## 2015-10-04 DIAGNOSIS — Z882 Allergy status to sulfonamides status: Secondary | ICD-10-CM

## 2015-10-04 DIAGNOSIS — I341 Nonrheumatic mitral (valve) prolapse: Secondary | ICD-10-CM | POA: Diagnosis present

## 2015-10-04 DIAGNOSIS — K219 Gastro-esophageal reflux disease without esophagitis: Secondary | ICD-10-CM | POA: Diagnosis present

## 2015-10-04 DIAGNOSIS — Z9889 Other specified postprocedural states: Secondary | ICD-10-CM

## 2015-10-04 DIAGNOSIS — I509 Heart failure, unspecified: Secondary | ICD-10-CM

## 2015-10-04 DIAGNOSIS — Z8051 Family history of malignant neoplasm of kidney: Secondary | ICD-10-CM

## 2015-10-04 DIAGNOSIS — Z9049 Acquired absence of other specified parts of digestive tract: Secondary | ICD-10-CM | POA: Diagnosis not present

## 2015-10-04 DIAGNOSIS — E785 Hyperlipidemia, unspecified: Secondary | ICD-10-CM | POA: Diagnosis present

## 2015-10-04 DIAGNOSIS — G9341 Metabolic encephalopathy: Secondary | ICD-10-CM | POA: Diagnosis present

## 2015-10-04 DIAGNOSIS — Z79899 Other long term (current) drug therapy: Secondary | ICD-10-CM | POA: Diagnosis not present

## 2015-10-04 DIAGNOSIS — I499 Cardiac arrhythmia, unspecified: Secondary | ICD-10-CM | POA: Diagnosis present

## 2015-10-04 DIAGNOSIS — I429 Cardiomyopathy, unspecified: Secondary | ICD-10-CM | POA: Diagnosis present

## 2015-10-04 DIAGNOSIS — B9620 Unspecified Escherichia coli [E. coli] as the cause of diseases classified elsewhere: Secondary | ICD-10-CM | POA: Diagnosis present

## 2015-10-04 DIAGNOSIS — I340 Nonrheumatic mitral (valve) insufficiency: Secondary | ICD-10-CM | POA: Diagnosis present

## 2015-10-04 DIAGNOSIS — M1990 Unspecified osteoarthritis, unspecified site: Secondary | ICD-10-CM | POA: Diagnosis present

## 2015-10-04 DIAGNOSIS — I38 Endocarditis, valve unspecified: Secondary | ICD-10-CM | POA: Diagnosis present

## 2015-10-04 DIAGNOSIS — Z66 Do not resuscitate: Secondary | ICD-10-CM | POA: Diagnosis present

## 2015-10-04 DIAGNOSIS — E860 Dehydration: Secondary | ICD-10-CM | POA: Diagnosis present

## 2015-10-04 DIAGNOSIS — R079 Chest pain, unspecified: Secondary | ICD-10-CM

## 2015-10-04 DIAGNOSIS — N3000 Acute cystitis without hematuria: Secondary | ICD-10-CM | POA: Diagnosis not present

## 2015-10-04 DIAGNOSIS — I482 Chronic atrial fibrillation: Secondary | ICD-10-CM | POA: Diagnosis not present

## 2015-10-04 DIAGNOSIS — A419 Sepsis, unspecified organism: Secondary | ICD-10-CM | POA: Diagnosis present

## 2015-10-04 LAB — URINALYSIS COMPLETE WITH MICROSCOPIC (ARMC ONLY)
BILIRUBIN URINE: NEGATIVE
Glucose, UA: NEGATIVE mg/dL
KETONES UR: NEGATIVE mg/dL
NITRITE: NEGATIVE
PH: 5 (ref 5.0–8.0)
PROTEIN: 100 mg/dL — AB
Specific Gravity, Urine: 1.02 (ref 1.005–1.030)

## 2015-10-04 LAB — CBC
HCT: 37.5 % (ref 35.0–47.0)
Hemoglobin: 12.3 g/dL (ref 12.0–16.0)
MCH: 30.6 pg (ref 26.0–34.0)
MCHC: 32.9 g/dL (ref 32.0–36.0)
MCV: 92.8 fL (ref 80.0–100.0)
PLATELETS: 189 10*3/uL (ref 150–440)
RBC: 4.04 MIL/uL (ref 3.80–5.20)
RDW: 14.1 % (ref 11.5–14.5)
WBC: 11.5 10*3/uL — ABNORMAL HIGH (ref 3.6–11.0)

## 2015-10-04 LAB — BASIC METABOLIC PANEL
Anion gap: 9 (ref 5–15)
BUN: 43 mg/dL — AB (ref 6–20)
CALCIUM: 9.4 mg/dL (ref 8.9–10.3)
CHLORIDE: 104 mmol/L (ref 101–111)
CO2: 29 mmol/L (ref 22–32)
CREATININE: 0.88 mg/dL (ref 0.44–1.00)
GFR calc Af Amer: >60 mL/min (ref >60)
GFR calc non Af Amer: 53 mL/min — ABNORMAL LOW (ref >60)
Glucose, Bld: 111 mg/dL — ABNORMAL HIGH (ref 65–99)
Potassium: 3.6 mmol/L (ref 3.5–5.1)
Sodium: 142 mmol/L (ref 135–145)

## 2015-10-04 LAB — TROPONIN I
TROPONIN I: 0.09 ng/mL — AB (ref <0.031)
Troponin I: 0.1 ng/mL — ABNORMAL HIGH (ref <0.031)
Troponin I: 0.09 ng/mL — ABNORMAL HIGH (ref <0.031)

## 2015-10-04 LAB — LACTIC ACID, PLASMA
LACTIC ACID, VENOUS: 1.7 mmol/L (ref 0.5–2.0)
Lactic Acid, Venous: 1.5 mmol/L (ref 0.5–2.0)

## 2015-10-04 LAB — DIGOXIN LEVEL: DIGOXIN LVL: 0.9 ng/mL (ref 0.8–2.0)

## 2015-10-04 MED ORDER — MELOXICAM 7.5 MG PO TABS
7.5000 mg | ORAL_TABLET | Freq: Every day | ORAL | Status: DC
  Administered 2015-10-04 – 2015-10-08 (×5): 7.5 mg via ORAL
  Filled 2015-10-04 (×5): qty 1

## 2015-10-04 MED ORDER — DILTIAZEM HCL ER COATED BEADS 240 MG PO CP24
240.0000 mg | ORAL_CAPSULE | Freq: Every day | ORAL | Status: DC
  Administered 2015-10-04 – 2015-10-08 (×5): 240 mg via ORAL
  Filled 2015-10-04 (×5): qty 1

## 2015-10-04 MED ORDER — DIGOXIN 125 MCG PO TABS
62.5000 ug | ORAL_TABLET | Freq: Every day | ORAL | Status: DC
  Administered 2015-10-04 – 2015-10-08 (×5): 62.5 ug via ORAL
  Filled 2015-10-04 (×5): qty 1

## 2015-10-04 MED ORDER — ONDANSETRON HCL 4 MG PO TABS: 4.0000 mg | ORAL_TABLET | Freq: Four times a day (QID) | ORAL | Status: DC | PRN

## 2015-10-04 MED ORDER — ALUM & MAG HYDROXIDE-SIMETH 200-200-20 MG/5ML PO SUSP: 30.0000 mL | Freq: Four times a day (QID) | ORAL | Status: DC | PRN

## 2015-10-04 MED ORDER — MIRTAZAPINE 15 MG PO TABS
45.0000 mg | ORAL_TABLET | Freq: Every day | ORAL | Status: DC
Start: 2015-10-04 — End: 2015-10-08
  Administered 2015-10-04 – 2015-10-07 (×4): 45 mg via ORAL
  Filled 2015-10-04 (×4): qty 3

## 2015-10-04 MED ORDER — SODIUM CHLORIDE 0.9 % IV SOLN: 250.0000 mL | INTRAVENOUS | Status: DC | PRN

## 2015-10-04 MED ORDER — SODIUM CHLORIDE 0.9 % IJ SOLN
3.0000 mL | Freq: Two times a day (BID) | INTRAMUSCULAR | Status: DC
  Administered 2015-10-04 – 2015-10-07 (×7): 3 mL via INTRAVENOUS

## 2015-10-04 MED ORDER — ACETAMINOPHEN 500 MG PO TABS
1000.0000 mg | ORAL_TABLET | Freq: Every day | ORAL | Status: DC
  Administered 2015-10-05 – 2015-10-08 (×4): 1000 mg via ORAL
  Filled 2015-10-04 (×4): qty 2

## 2015-10-04 MED ORDER — ACETAMINOPHEN 650 MG RE SUPP: 650.0000 mg | Freq: Four times a day (QID) | RECTAL | Status: DC | PRN

## 2015-10-04 MED ORDER — ASPIRIN 81 MG PO CHEW: 324.0000 mg | CHEWABLE_TABLET | Freq: Once | ORAL | Status: DC

## 2015-10-04 MED ORDER — ACETAMINOPHEN 325 MG PO TABS: 650.0000 mg | ORAL_TABLET | Freq: Four times a day (QID) | ORAL | Status: DC | PRN

## 2015-10-04 MED ORDER — NITROGLYCERIN 0.4 MG SL SUBL: 0.4000 mg | SUBLINGUAL_TABLET | SUBLINGUAL | Status: DC | PRN

## 2015-10-04 MED ORDER — ENOXAPARIN SODIUM 30 MG/0.3ML ~~LOC~~ SOLN
30.0000 mg | SUBCUTANEOUS | Status: DC
  Administered 2015-10-04 – 2015-10-05 (×2): 30 mg via SUBCUTANEOUS
  Filled 2015-10-04 (×2): qty 0.3

## 2015-10-04 MED ORDER — FUROSEMIDE 20 MG PO TABS
20.0000 mg | ORAL_TABLET | Freq: Every day | ORAL | Status: DC
  Administered 2015-10-04 – 2015-10-08 (×5): 20 mg via ORAL
  Filled 2015-10-04 (×5): qty 1

## 2015-10-04 MED ORDER — SODIUM CHLORIDE 0.9 % IJ SOLN: 3.0000 mL | INTRAMUSCULAR | Status: DC | PRN

## 2015-10-04 MED ORDER — DOCUSATE SODIUM 100 MG PO CAPS
100.0000 mg | ORAL_CAPSULE | Freq: Two times a day (BID) | ORAL | Status: DC
Start: 2015-10-04 — End: 2015-10-08
  Administered 2015-10-04 – 2015-10-08 (×6): 100 mg via ORAL
  Filled 2015-10-04 (×8): qty 1

## 2015-10-04 MED ORDER — DEXTROSE 5 % IV SOLN
1.0000 g | INTRAVENOUS | Status: DC
  Administered 2015-10-04 – 2015-10-06 (×3): 1 g via INTRAVENOUS
  Filled 2015-10-04 (×4): qty 10

## 2015-10-04 MED ORDER — PANTOPRAZOLE SODIUM 40 MG PO TBEC
40.0000 mg | DELAYED_RELEASE_TABLET | Freq: Every day | ORAL | Status: DC
  Administered 2015-10-04 – 2015-10-08 (×5): 40 mg via ORAL
  Filled 2015-10-04 (×5): qty 1

## 2015-10-04 MED ORDER — ESCITALOPRAM OXALATE 10 MG PO TABS
10.0000 mg | ORAL_TABLET | Freq: Every day | ORAL | Status: DC
  Administered 2015-10-04 – 2015-10-08 (×5): 10 mg via ORAL
  Filled 2015-10-04 (×5): qty 1

## 2015-10-04 MED ORDER — LOSARTAN POTASSIUM 50 MG PO TABS
50.0000 mg | ORAL_TABLET | Freq: Every day | ORAL | Status: DC
  Administered 2015-10-05 – 2015-10-07 (×3): 50 mg via ORAL
  Filled 2015-10-04 (×5): qty 1

## 2015-10-04 MED ORDER — DEXTROSE 5 % IV SOLN: 1.0000 g | INTRAVENOUS | Status: DC

## 2015-10-04 MED ORDER — ONDANSETRON HCL 4 MG/2ML IJ SOLN: 4.0000 mg | Freq: Four times a day (QID) | INTRAMUSCULAR | Status: DC | PRN

## 2015-10-04 NOTE — Progress Notes (Signed)
Notified MD of pt.'s BP 125/70 regarding medication Diltiazem and Losartan administration. New orders to hold Losartan and give Diltiazem at this time. Will continue to monitor pt.  [name]

## 2015-10-04 NOTE — H&P (Signed)
[facility] - [place] [facility]   PATIENT NAME: [name]    MR#:  [id number]  DATE OF BIRTH:  April 08, 1915  DATE OF ADMISSION:  10/04/2015  PRIMARY CARE PHYSICIAN: Sherlene Shams, MD   REQUESTING/REFERRING PHYSICIAN: Huel Cote  CHIEF COMPLAINT:  Generalized weakness  HISTORY OF PRESENT ILLNESS:  [name]  is a 81 y.o. female with a known history of chronic atrial fibrillation, not on any anticoagulants, hypertension, GERD and multiple other medical problems is presenting to the ED with a chief complaint of generalized weakness. Patient is eating poorly for the past to 3 days and not being herself according to her daughter. Patient is brought into the ED, urinalysis abnormal and patient is started on IV Rocephin. Patient has intermittent episodes of chest pain but currently chest pain-free, troponin is elevated but EKG with no abnormal changes. Chest x-ray with mild CHF  PAST MEDICAL HISTORY:   Past Medical History  Diagnosis Date  . Incontinence of urine     bladder tac, collagen injections (Dr. Evelene Croon)  . Diverticulosis     with bleeding  . Atrial fibrillation (HCC)   . Arthritis     secondary to OA and DJD  . Hypertension   . GERD (gastroesophageal reflux disease)   . Heart murmur   . Hyperlipidemia   . CHF (congestive heart failure) (HCC)     EF 35-35% by y last ECHO  . Diverticulosis of colon     PAST SURGICAL HISTOIRY:   Past Surgical History  Procedure Laterality Date  . Cholecystectomy    . Laminectomy    . Cesarean section    . Shoulder hemi-arthroplasty  2010    right shoulder intramedullar rod and screw  . Spinal cord decompression      laminectomy  . Joint replacement      bilateral knee    SOCIAL HISTORY:   Social History  Substance Use Topics  . Smoking status: Never Smoker   . Smokeless tobacco: Never Used  . Alcohol Use: No    FAMILY HISTORY:   Family History  Problem Relation Age of Onset  . Cancer  Son     renal cell carcinoma    DRUG ALLERGIES:  No Known Allergies  REVIEW OF SYSTEMS:  CONSTITUTIONAL: No fever, reporting fatigue or weakness.  EYES: No blurred or double vision.  EARS, NOSE, AND THROAT: No tinnitus or ear pain.  RESPIRATORY: No cough, shortness of breath, wheezing or hemoptysis.  CARDIOVASCULAR: No chest pain, orthopnea, edema.  GASTROINTESTINAL: Decreased appetite, No nausea, vomiting, diarrhea or abdominal pain.  GENITOURINARY: No dysuria, hematuria.  ENDOCRINE: No polyuria, nocturia,  HEMATOLOGY: No anemia, easy bruising or bleeding SKIN: No rash or lesion. MUSCULOSKELETAL: No joint pain or arthritis.   NEUROLOGIC: No tingling, numbness, weakness.  PSYCHIATRY: No anxiety or depression.   MEDICATIONS AT HOME:   Prior to Admission medications   Medication Sig Start Date End Date Taking? Authorizing Provider  acetaminophen (TYLENOL) 500 MG tablet Take 1,000 mg by mouth daily after breakfast.   Yes Historical Provider, MD  Calcium Carbonate-Vitamin D 600-400 MG-UNIT per tablet TAKE 1 TABLET BY MOUTH 2 TIMES A DAY WITH MEALS. 06/07/15  Yes Sherlene Shams, MD  digoxin (LANOXIN) 0.125 MG tablet TAKE 1/2 TABLET BY MOUTH EACH MORNING FOR HEART Patient taking differently: TAKE 1/2 TABLET BY MOUTH ONCE DAILY 09/16/15  Yes Sherlene Shams, MD  diltiazem (CARDIZEM CD) 240 MG 24 hr capsule TAKE ONE CAPSULE BY MOUTH  ONCE DAILY FORHEART AND BLOOD PRESSURE (HIGH) 09/09/15  Yes Sherlene Shams, MD  escitalopram (LEXAPRO) 10 MG tablet TAKE 1 TABLET BY MOUTH ONCE DAILY FOR DEPRESSION. 09/24/15  Yes Sherlene Shams, MD  furosemide (LASIX) 20 MG tablet TAKE 1 TABLET BY MOUTH ONCE DAILY FOR EDEMA 08/20/15  Yes Sherlene Shams, MD  losartan (COZAAR) 50 MG tablet TAKE 1 TABLET ONCE DAILY FOR HIGH BLOOD PRESSURE 08/20/15  Yes Sherlene Shams, MD  meloxicam (MOBIC) 7.5 MG tablet Take 1 tablet (7.5 mg total) by mouth daily. 08/13/15  Yes Sherlene Shams, MD  mirtazapine (REMERON) 45 MG tablet  Take 1 tablet (45 mg total) by mouth at bedtime. 08/13/15  Yes Sherlene Shams, MD  omeprazole (PRILOSEC) 40 MG capsule TAKE ONE CAPSULE BY MOUTH ONCE DAILY FOR GERD 09/09/15  Yes Sherlene Shams, MD  Vitamin D, Ergocalciferol, (DRISDOL) 50000 UNITS CAPS capsule TAKE 1 CAPSULE BY MOUTH ONCE WEEKLY FOR SUPPLEMENT Patient taking differently: Take 50,000 Units by mouth every Sunday.  10/30/14  Yes Sherlene Shams, MD      VITAL SIGNS:  Blood pressure 137/68, pulse 96, temperature 98.5 F (36.9 C), temperature source Oral, resp. rate 34, height  (1.651 m), weight 51.256 kg (113 lb), SpO2 94 %.  PHYSICAL EXAMINATION:  GENERAL:  81 y.o.-year-old patient lying in the bed with no acute distress.  EYES: Pupils equal, round, reactive to light and accommodation. No scleral icterus. Extraocular muscles intact.  HEENT: Head atraumatic, normocephalic. Oropharynx and nasopharynx clear.  NECK:  Supple, no jugular venous distention. No thyroid enlargement, no tenderness.  LUNGS: Moderate breath sounds bilaterally, no wheezing, rales,rhonchi or crepitation. No use of accessory muscles of respiration.  CARDIOVASCULAR: S1, S2 normal. No murmurs, rubs, or gallops.  ABDOMEN: Soft, nontender, nondistended. Bowel sounds present. No organomegaly or mass.  EXTREMITIES: No pedal edema, cyanosis, or clubbing.  NEUROLOGIC: Cranial nerves II through XII are intact. Muscle strength 5/5 in all extremities. Sensation intact. Gait not checked.  PSYCHIATRIC: The patient is alert and oriented x2- 3.  SKIN: No obvious rash, lesion, or ulcer.   LABORATORY PANEL:   CBC  Recent Labs Lab 10/04/15 0859  WBC 11.5*  HGB 12.3  HCT 37.5  PLT 189   ------------------------------------------------------------------------------------------------------------------  Chemistries   Recent Labs Lab 10/04/15 0859  NA 142  K 3.6  CL 104  CO2 29  GLUCOSE 111*  BUN 43*  CREATININE 0.88  CALCIUM 9.4    ------------------------------------------------------------------------------------------------------------------  Cardiac Enzymes  Recent Labs Lab 10/04/15 0859  TROPONINI 0.10*   ------------------------------------------------------------------------------------------------------------------  RADIOLOGY:  Dg Chest 2 View  10/04/2015  CLINICAL DATA:  Chest pain for 2 days EXAM: CHEST - 2 VIEW COMPARISON:  04/10/2013 FINDINGS: Cardiac shadow is enlarged. Aortic calcifications are again noted. The lungs are well aerated bilaterally without focal infiltrate or sizable effusion. Mild vascular congestion interstitial change is noted consistent with mild CHF. No other focal abnormality is noted. IMPRESSION: Mild CHF. Electronically Signed   By: Alcide Clever M.D.   On: 10/04/2015 09:25    EKG:   Orders placed or performed during the hospital encounter of 10/04/15  . ED EKG within 10 minutes  . ED EKG within 10 minutes  . EKG 12-Lead  . EKG 12-Lead    IMPRESSION AND PLAN:   1. SIRS secondary to acute cystitis-meets criteria at the time of admission for sepsis with the leukocytosis and sinus tachycardia Urine culture and sensitivities ordered in the ED. Patient is  started on IV Rocephin and will continue the same Decreased appetite is probably from sepsis  2. Elevated troponin probably demand ischemia, troponin at 0.10 Will monitor troponins closely Telemetry Currently patient is chest pain-free Provide aspirin and check fasting lipid panel Will consult cardiology as troponin is at 0.10  3. Mild CHF per  chest x-ray Patient is not fluid overloaded at this time Continue her home medication Lasix Monitor daily weights and intake and output Check dig levels  4. Essential hypertension Resume her home medication Cardizem and titrate as needed  5. GERD Continue Protonix  6. Generalized weakness Consult is placed to physical therapy   All the records are reviewed and  case discussed with ED provider. Management plans discussed with the patient, family and they are in agreement.  CODE STATUS: DO NOT RESUSCITATE, daughter is the healthcare power of attorney  TOTAL TIME TAKING CARE OF THIS PATIENT: 45 minutes.    Ramonita LabGouru, Louisa Favaro M.D on 10/04/2015 at 11:08 AM  Between 7am to 6pm - Pager - [phone]  After 6pm go to [email or web address] - password [email or web address] [facility]  [facility]  Office  [phone]  CC: Primary care physician; Sherlene ShamsULLO, TERESA L, MD

## 2015-10-04 NOTE — ED Notes (Signed)
Patient transported to X-ray 

## 2015-10-04 NOTE — ED Provider Notes (Signed)
Time Seen: Approximately 0915  I have reviewed the triage notes  Chief Complaint: Chest Pain   History of Present Illness: [name] is a 81 y.o. female who arrives from a local assisted living home and is currently here with her daughter who is the primary historian at times. Patient was transported here due to complaints of some recent chest discomfort. Patient denies any chest pain at present and the daughter states she was mainly concerned over decreased appetite. Much in way of food or fluid intake over the last 48 hours. The patient has not had any vomiting or diarrhea. The daughter is not aware of any fever or chills. Chest pain is been occurring over the last 48 hours. No obvious radiation to the arm or jaw area. Patient has history congestive heart failure and daughter states that she may have had some increased shortness of breath over the last 2 days. No peripheral edema calf tenderness or swelling is been noted.   Past Medical History  Diagnosis Date  . Incontinence of urine     bladder tac, collagen injections (Dr. Evelene Croon)  . Diverticulosis     with bleeding  . Atrial fibrillation (HCC)   . Arthritis     secondary to OA and DJD  . Hypertension   . GERD (gastroesophageal reflux disease)   . Heart murmur   . Hyperlipidemia   . CHF (congestive heart failure) (HCC)     EF 35-35% by y last ECHO  . Diverticulosis of colon     Patient Active Problem List   Diagnosis Date Noted  . Sepsis (HCC) 10/04/2015  . OA (osteoarthritis) of knee 09/11/2014  . Bilateral hearing loss due to cerumen impaction 06/23/2014  . Major depression, melancholic type (HCC) 01/01/2014  . Failure to thrive in adult 12/03/2013  . Chronic right hip pain 05/05/2013  . Adnexal mass 05/05/2013  . Vitamin D deficiency 02/11/2013  . Constipation 04/09/2012  . Chronic atrial fibrillation (HCC) 07/26/2011  . Congestive heart failure with left ventricular systolic dysfunction (HCC) 07/26/2011  .  Hypertension   . GERD (gastroesophageal reflux disease)   . Heart murmur   . Hyperlipidemia   . Diverticulosis of colon     Past Surgical History  Procedure Laterality Date  . Cholecystectomy    . Laminectomy    . Cesarean section    . Shoulder hemi-arthroplasty  2010    right shoulder intramedullar rod and screw  . Spinal cord decompression      laminectomy  . Joint replacement      bilateral knee    Past Surgical History  Procedure Laterality Date  . Cholecystectomy    . Laminectomy    . Cesarean section    . Shoulder hemi-arthroplasty  2010    right shoulder intramedullar rod and screw  . Spinal cord decompression      laminectomy  . Joint replacement      bilateral knee    Current Outpatient Rx  Name  Route  Sig  Dispense  Refill  . acetaminophen (TYLENOL) 500 MG tablet   Oral   Take 1,000 mg by mouth daily after breakfast.         . Calcium Carbonate-Vitamin D 600-400 MG-UNIT per tablet      TAKE 1 TABLET BY MOUTH 2 TIMES A DAY WITH MEALS.   60 tablet   6   . digoxin (LANOXIN) 0.125 MG tablet      TAKE 1/2 TABLET BY MOUTH EACH MORNING  FOR HEART Patient taking differently: TAKE 1/2 TABLET BY MOUTH ONCE DAILY   15 tablet   5   . diltiazem (CARDIZEM CD) 240 MG 24 hr capsule      TAKE ONE CAPSULE BY MOUTH ONCE DAILY FORHEART AND BLOOD PRESSURE (HIGH)   30 capsule   2   . escitalopram (LEXAPRO) 10 MG tablet      TAKE 1 TABLET BY MOUTH ONCE DAILY FOR DEPRESSION.   30 tablet   11   . furosemide (LASIX) 20 MG tablet      TAKE 1 TABLET BY MOUTH ONCE DAILY FOR EDEMA   90 tablet   3   . losartan (COZAAR) 50 MG tablet      TAKE 1 TABLET ONCE DAILY FOR HIGH BLOOD PRESSURE   30 tablet   5   . meloxicam (MOBIC) 7.5 MG tablet   Oral   Take 1 tablet (7.5 mg total) by mouth daily.   90 tablet   1   . mirtazapine (REMERON) 45 MG tablet   Oral   Take 1 tablet (45 mg total) by mouth at bedtime.   90 tablet   1   . omeprazole (PRILOSEC) 40 MG  capsule      TAKE ONE CAPSULE BY MOUTH ONCE DAILY FOR GERD   30 capsule   2   . Vitamin D, Ergocalciferol, (DRISDOL) 50000 UNITS CAPS capsule      TAKE 1 CAPSULE BY MOUTH ONCE WEEKLY FOR SUPPLEMENT Patient taking differently: Take 50,000 Units by mouth every Sunday.    12 capsule   3     Allergies:  Review of patient's allergies indicates no known allergies.  Family History: Family History  Problem Relation Age of Onset  . Cancer Son     renal cell carcinoma    Social History: Social History  Substance Use Topics  . Smoking status: Never Smoker   . Smokeless tobacco: Never Used  . Alcohol Use: No     Review of Systems:   10 point review of systems was performed and was otherwise negative:  Constitutional: No fever Eyes: No visual disturbances ENT: No sore throat, ear pain Cardiac: No chest pain Respiratory: No shortness of breath, wheezing, or stridor Abdomen: No abdominal pain, no vomiting, No diarrhea Endocrine: No weight loss, No night sweats Extremities: No peripheral edema, cyanosis Skin: No rashes, easy bruising Neurologic: No focal weakness, trouble with speech or swollowing Urologic: No dysuria, Hematuria, or urinary frequency  Physical Exam:  ED Triage Vitals  Enc Vitals Group     BP 10/04/15 0852 151/73 mmHg     Pulse Rate 10/04/15 0852 103     Resp 10/04/15 0852 16     Temp 10/04/15 0852 98.5 F (36.9 C)     Temp Source 10/04/15 0852 Oral     SpO2 10/04/15 0852 95 %     Weight 10/04/15 0852 113 lb (51.256 kg)     Height 10/04/15 0852  (1.651 m)     Head Cir --      Peak Flow --      Pain Score --      Pain Loc --      Pain Edu? --      Excl. in GC? --     General: Awake , Alert , and Oriented times 2. Patient answers most questions appropriately. Appears younger than stated age. Head: Normal cephalic , atraumatic Eyes: Pupils equal , round, reactive to light Nose/Throat: No nasal  drainage, patent upper airway without erythema  or exudate.  Neck: Supple, Full range of motion, No anterior adenopathy or palpable thyroid masses Lungs: Clear to ascultation without wheezes , rhonchi, or rales Heart: Regular rate, regular rhythm without murmurs , gallops , or rubs Abdomen: Mild tenderness toward the left lower quadrant without rebound, guarding , or rigidity; bowel sounds positive and symmetric in all 4 quadrants. No organomegaly .        Extremities: 2 plus symmetric pulses. No edema, clubbing or cyanosis Neurologic: normal ambulation, Motor symmetric without deficits, sensory intact Skin: warm, dry, no rashes   Labs:   All laboratory work was reviewed including any pertinent negatives or positives listed below:  Labs Reviewed  BASIC METABOLIC PANEL - Abnormal; Notable for the following:    Glucose, Bld 111 (*)    BUN 43 (*)    GFR calc non Af Amer 53 (*)    All other components within normal limits  CBC - Abnormal; Notable for the following:    WBC 11.5 (*)    All other components within normal limits  TROPONIN I - Abnormal; Notable for the following:    Troponin I 0.10 (*)    All other components within normal limits  URINALYSIS COMPLETEWITH MICROSCOPIC ([facility] ONLY) - Abnormal; Notable for the following:    Color, Urine AMBER (*)    APPearance HAZY (*)    Hgb urine dipstick 2+ (*)    Protein, ur 100 (*)    Leukocytes, UA 1+ (*)    Bacteria, UA MANY (*)    Squamous Epithelial / LPF 0-5 (*)    All other components within normal limits  URINE CULTURE  LACTIC ACID, PLASMA  LACTIC ACID, PLASMA   review of the patient's laboratory work shows a significant urinary tract infection with an elevated white blood cell count and troponin level.  EKG:  ED ECG REPORT I, Jennye MoccasinBrian S Quigley, the attending physician, personally viewed and interpreted this ECG.  Date: 10/04/2015 EKG Time: *0856 Rate: *104 Rhythm: Atrial fibrillation QRS Axis: Left axis deviation Intervals: normal ST/T Wave abnormalities: Nonspecific  ST-T wave abnormalities Conduction Disutrbances: none Narrative Interpretation: unremarkable    Radiology:  EXAM: CHEST - 2 VIEW  COMPARISON: 04/10/2013  FINDINGS: Cardiac shadow is enlarged. Aortic calcifications are again noted. The lungs are well aerated bilaterally without focal infiltrate or sizable effusion. Mild vascular congestion interstitial change is noted consistent with mild CHF. No other focal abnormality is noted.  IMPRESSION: Mild CHF.      I personally reviewed the radiologic studies   ED Course:  Patient's stay here was uneventful and she presents with some laboratory abnormalities of significance is urinary tract infection which may have caused her decreased appetite and has some mild dehydration. Patient was given IV fluid bolus. She also has an elevated troponin without any obvious ischemic changes on her EKG and her atrial fibrillation appears to be chronic as is her congestive heart failure.    Assessment: Acute unspecified chest pain Elevated troponin of unknown clinical significance Dehydration Chronic atrial fibrillation Urinary tract infection     Plan:  Inpatient managment            Jennye MoccasinBrian S Quigley, MD 10/04/15 1115

## 2015-10-04 NOTE — ED Notes (Signed)
MD updated of critical troponin 0.10

## 2015-10-04 NOTE — ED Notes (Signed)
Admitting MD at bedside.

## 2015-10-04 NOTE — Progress Notes (Signed)
Pt arrived on unit. Report given by Connye BurkittAlly in the ED. VSS.   Karsten RoLauren E Hobbs

## 2015-10-04 NOTE — Progress Notes (Signed)
*  PRELIMINARY RESULTS* Echocardiogram 2D Echocardiogram has been performed.  Garrel Ridgelikeshia S Stills 10/04/2015, 8:22 PM

## 2015-10-04 NOTE — Clinical Social Work Note (Signed)
Clinical Social Work Assessment  Patient Details  Name: [name] MRN: [id number] Date of Birth: 1915-09-22  Date of referral:  10/04/15               Reason for consult:  Facility Placement                Permission sought to share information with:    Permission granted to share information::   (Patient resting at time of visit)  Name::        Agency::     Relationship::     Contact Information:     Housing/Transportation Living arrangements for the past 2 months:  Group Home Source of Information:  Adult Children Patient Interpreter Needed:  None Criminal Activity/Legal Involvement Pertinent to Current Situation/Hospitalization:  No - Comment as needed Significant Relationships:  Adult Children Lives with:  Facility Resident Do you feel safe going back to the place where you live?  Yes Need for family participation in patient care:  Yes (Comment)  Care giving concerns:  [facility]   Social Worker assessment / plan:  Patient admitted to hospital this afternoon and CSW informed [facility]. CSW spoke with patient's daughter: Gypsy DecantJeanette Hayes: [phone]: [phone] who was visiting with patient this afternoon. Patient was alert for part of assessment but fell asleep during and was not talkative at any time. Patient's daughter stated that they have been very pleased [facility] and [facility] and wish for her to return there when time. CSW began FL2 in epic. CSW attempted to reach owner of [facility]: Nolberto HanlonMarilyn Williams:: [phone] but there was no answer and no way to leave a message.  Employment status:  Retired Database administratornsurance information:  Managed Medicare PT Recommendations:  Not assessed at this time Information / Referral to community resources:     Patient/Family's Response to care:  Patient's daughter very appreciative of CSW visit.  Patient/Family's Understanding of and Emotional  Response to Diagnosis, Current Treatment, and Prognosis:  Patient's daughter very supportive of patient and involved in care.  Emotional Assessment Appearance:  Appears stated age Attitude/Demeanor/Rapport:   (pleasant and quiet) Affect (typically observed):  Calm Orientation:  Oriented to Self Alcohol / Substance use:  Not Applicable Psych involvement (Current and /or in the community):  No (Comment)  Discharge Needs  Concerns to be addressed:  Care Coordination Readmission within the last 30 days:  No Current discharge risk:  None Barriers to Discharge:  No Barriers Identified   York SpanielMonica Leora Platt, LCSW 10/04/2015, 1:55 PM

## 2015-10-04 NOTE — ED Notes (Signed)
[facility] c/o intermittent CP X 2 days. Center of chest 2/10 pain. Pt received 324 aspirin PTA. Pt denies pain at this time.

## 2015-10-05 LAB — CBC
HCT: 35.6 % (ref 35.0–47.0)
Hemoglobin: 12 g/dL (ref 12.0–16.0)
MCH: 31.7 pg (ref 26.0–34.0)
MCHC: 33.8 g/dL (ref 32.0–36.0)
MCV: 94 fL (ref 80.0–100.0)
Platelets: 188 10*3/uL (ref 150–440)
RBC: 3.79 MIL/uL — ABNORMAL LOW (ref 3.80–5.20)
RDW: 14.2 % (ref 11.5–14.5)
WBC: 8.4 10*3/uL (ref 3.6–11.0)

## 2015-10-05 LAB — COMPREHENSIVE METABOLIC PANEL
ALBUMIN: 3.4 g/dL — AB (ref 3.5–5.0)
ALK PHOS: 41 U/L (ref 38–126)
ALT: 15 U/L (ref 14–54)
AST: 18 U/L (ref 15–41)
Anion gap: 9 (ref 5–15)
BILIRUBIN TOTAL: 1.3 mg/dL — AB (ref 0.3–1.2)
BUN: 36 mg/dL — AB (ref 6–20)
CALCIUM: 8.8 mg/dL — AB (ref 8.9–10.3)
CO2: 28 mmol/L (ref 22–32)
Chloride: 107 mmol/L (ref 101–111)
Creatinine, Ser: 0.64 mg/dL (ref 0.44–1.00)
GFR calc Af Amer: >60 mL/min (ref >60)
GFR calc non Af Amer: >60 mL/min (ref >60)
GLUCOSE: 96 mg/dL (ref 65–99)
Potassium: 3.5 mmol/L (ref 3.5–5.1)
SODIUM: 144 mmol/L (ref 135–145)
TOTAL PROTEIN: 6.4 g/dL — AB (ref 6.5–8.1)

## 2015-10-05 LAB — TROPONIN I: Troponin I: 0.09 ng/mL — ABNORMAL HIGH (ref <0.031)

## 2015-10-05 NOTE — Evaluation (Signed)
Clinical/Bedside Swallow Evaluation Patient Details  Name: [name] MRN: [id number] Date of Birth: 1915/05/04  Today's Date: 10/05/2015 Time: SLP Start Time (ACUTE ONLY): 0945 SLP Stop Time (ACUTE ONLY): 1045 SLP Time Calculation (min) (ACUTE ONLY): 60 min  Past Medical History:  Past Medical History  Diagnosis Date  . Incontinence of urine     bladder tac, collagen injections (Dr. Evelene Croon)  . Diverticulosis     with bleeding  . Atrial fibrillation (HCC)   . Arthritis     secondary to OA and DJD  . Hypertension   . GERD (gastroesophageal reflux disease)   . Heart murmur   . Hyperlipidemia   . CHF (congestive heart failure) (HCC)     EF 35-35% by y last ECHO  . Diverticulosis of colon    Past Surgical History:  Past Surgical History  Procedure Laterality Date  . Cholecystectomy    . Laminectomy    . Shoulder hemi-arthroplasty  2010    right shoulder intramedullar rod and screw  . Spinal cord decompression      laminectomy  . Joint replacement      bilateral knee   HPI:  Pt is a 81 y.o. female with a known history of chronic atrial fibrillation, not on any anticoagulants, hypertension, GERD and multiple other medical problems is presenting to the ED with a chief complaint of generalized weakness. Patient is eating poorly for the past to 3 days and not being herself according to her daughter. Patient is brought into the ED, urinalysis abnormal and patient is started on IV Rocephin. Patient has intermittent episodes of chest pain but currently chest pain-free, troponin is elevated but EKG with no abnormal changes. Chest x-ray with mild CHF. Pt has a coughing episode last evening w/ NSG; pt is taking some meds crushed in puree and others whole w/ water per report.    Assessment / Plan / Recommendation Clinical Impression  Pt appears to adequately tolerate trials of thin liquids and purees; pt declined trials of soft solids w/ this eval but is able to eat "soft foods" per  Dtr present during eval. She only attempted 1-2 bites of food this morning (w/ her regular diet consistency) but seemed to prefer the puree style foods that were easy to eat (ice cream, applesauce). No immediate, overt s/s of aspiration noted w/ any trials. Pt consumed trials w/ no significant oral phase deficits noted as well although suspect mastication effort/time could be increased as texture increases to soft solids d/t pt lacking some dentition. Pt appears at reduced risk for aspiration following general aspiration precautions. Thoroughly discussed aspiration precautions w/ pt and Dtr and rec.'d meds in Puree and demo. this w/ applesauce. Pt appeared to receptive to this. Rec. a Dys. 3 diet w/ thin liquids; meds in Puree. Assist at meals w/ tray setup and feeding as nec. NSG updated.Dtr agreed.    Aspiration Risk   (reduced)    Diet Recommendation  Mech soft w/ thin liquids; aspiration precautions; assistance at meals as nec.   Medication Administration: Whole meds with puree    Other  Recommendations Oral Care Recommendations: Oral care BID;Staff/trained caregiver to provide oral care   Follow up Recommendations   (return to her living facility)    Frequency and Duration min 2x/week  1 week       Prognosis Prognosis for Safe Diet Advancement: Good Barriers to Reach Goals:  (advanced age; GERD)      Swallow Study   General Date of  Onset: 10/04/15 HPI: Pt is a 81 y.o. female with a known history of chronic atrial fibrillation, not on any anticoagulants, hypertension, GERD and multiple other medical problems is presenting to the ED with a chief complaint of generalized weakness. Patient is eating poorly for the past to 3 days and not being herself according to her daughter. Patient is brought into the ED, urinalysis abnormal and patient is started on IV Rocephin. Patient has intermittent episodes of chest pain but currently chest pain-free, troponin is elevated but EKG with no abnormal  changes. Chest x-ray with mild CHF. Pt has a coughing episode last evening w/ NSG; pt is taking some meds crushed in puree and others whole w/ water per report.  Type of Study: Bedside Swallow Evaluation Previous Swallow Assessment: none Diet Prior to this Study: Regular;Thin liquids (per Dtr) Temperature Spikes Noted: No (wbc 8.4) Respiratory Status: Room air History of Recent Intubation: No Behavior/Cognition: Alert;Cooperative;Pleasant mood;Requires cueing (HOH) Oral Cavity Assessment: Within Functional Limits Oral Care Completed by SLP: Recent completion by staff Oral Cavity - Dentition: Missing dentition Vision: Functional for self-feeding Self-Feeding Abilities: Able to feed self;Needs assist;Needs set up Patient Positioning: Upright in bed (kyphosis apparent) Baseline Vocal Quality: Low vocal intensity Volitional Cough: Strong Volitional Swallow: Able to elicit    Oral/Motor/Sensory Function Overall Oral Motor/Sensory Function: Within functional limits (grossly for pt's baseline)   Ice Chips Ice chips: Within functional limits Presentation: Spoon (fed; 2 trials)   Thin Liquid Thin Liquid: Within functional limits Presentation: Cup;Self Fed (assisted; 8 trials)    Nectar Thick Nectar Thick Liquid: Not tested   Honey Thick Honey Thick Liquid: Not tested   Puree Puree: Within functional limits Presentation: Self Fed;Spoon (~6 ozs)   Solid Solid:  (did not attempt but requested to eat the ice cream)      Jerilynn SomKatherine Watson, MS, CCC-SLP  Watson,Katherine 10/05/2015,1:34 PM

## 2015-10-05 NOTE — Evaluation (Signed)
Physical Therapy Evaluation Patient Details Name: [name] MRN: [id number] DOB: 1915/08/02 Today's Date: 10/05/2015   History of Present Illness  81 yo female wiht onset of generalized weakness after UTI leading to sepsis.  CHF, SIRS, leukocytosis issues.  Has ALF family care home placement and will benefit from SNF  Clinical Impression  Pt was seen for assessment of mobility and is unsafe and weak since her decline with UTI and sepsis.  Her plan for PT's standpoint is SNF but pt's daughter wants to take her back to family care home.  Will continue therapy in hospital and recommend SNF for her care due to magnitude of weakness and safety issues.    Follow Up Recommendations SNF    Equipment Recommendations  Rolling walker with 5" wheels;Other (comment) (unless pt has a good safe walker)    Recommendations for Other Services Rehab consult     Precautions / Restrictions Precautions Precautions: Fall (telemetry) Restrictions Weight Bearing Restrictions: No      Mobility  Bed Mobility Overal bed mobility: Needs Assistance Bed Mobility: Supine to Sit;Sit to Supine     Supine to sit: Mod assist Sit to supine: Mod assist   General bed mobility comments: assist with scooting to edge of bed and to lift trunk off side of bed  Transfers Overall transfer level: Needs assistance Equipment used: Rolling walker (2 wheeled);1 person hand held assist Transfers: Sit to/from Stand Sit to Stand: Max assist         General transfer comment: dense cues and physical assist to power up  Ambulation/Gait             General Gait Details: pt could not fully stand to walk but daughter kept repeating that pt could use [facility] with staff  Stairs            Wheelchair Mobility    Modified Rankin (Stroke Patients Only)       Balance Overall balance assessment: Needs assistance Sitting-balance support: Feet supported Sitting balance-Leahy Scale: Fair     Standing  balance support: Bilateral upper extremity supported Standing balance-Leahy Scale: Poor                               Pertinent Vitals/Pain Pain Assessment: No/denies pain    Home Living Family/patient expects to be discharged to:: Assisted living               Home Equipment: Walker - 2 wheels;Shower seat;Bedside commode Additional Comments: has equipment in her health care setting    Prior Function Level of Independence: Needs assistance   Gait / Transfers Assistance Needed: supervised use of RW  ADL's / Homemaking Assistance Needed: family care home to perform all this  Comments: has augmenting devices for hearing which are buzzing and not really fully effective     Hand Dominance        Extremity/Trunk Assessment   Upper Extremity Assessment: Generalized weakness           Lower Extremity Assessment: Generalized weakness      Cervical / Trunk Assessment: Kyphotic  Communication   Communication: HOH  Cognition Arousal/Alertness: Awake/alert Behavior During Therapy: Flat affect Overall Cognitive Status: History of cognitive impairments - at baseline       Memory: Decreased recall of precautions;Decreased short-term memory              General Comments General comments (skin integrity, edema, etc.): Pt is extremely  weak despite family insistence of taking her home to family care home.  Her facility director arrived during the session to state pt would be fine to come back there.    Exercises        Assessment/Plan    PT Assessment Patient needs continued PT services  PT Diagnosis Generalized weakness;Difficulty walking;Altered mental status   PT Problem List Decreased strength;Decreased range of motion;Decreased activity tolerance;Decreased balance;Decreased mobility;Decreased coordination;Decreased cognition;Decreased knowledge of use of DME;Decreased safety awareness;Decreased knowledge of precautions;Cardiopulmonary status  limiting activity;Decreased skin integrity  PT Treatment Interventions DME instruction;Gait training;Functional mobility training;Therapeutic activities;Therapeutic exercise;Balance training;Neuromuscular re-education;Cognitive remediation;Patient/family education   PT Goals (Current goals can be found in the Care Plan section) Acute Rehab PT Goals Patient Stated Goal: none stated PT Goal Formulation: With family Time For Goal Achievement: 10/19/15 Potential to Achieve Goals: Good    Frequency Min 2X/week   Barriers to discharge Other (comment) (Pt needs 2 person assist to attempt gait/transfers to chair) SNF needs for this pt    Co-evaluation               End of Session   Activity Tolerance: Patient limited by fatigue;Patient limited by lethargy Patient left: in bed;with call bell/phone within reach;with bed alarm set;with family/visitor present Nurse Communication: Mobility status         Time: [id number] PT Time Calculation (min) (ACUTE ONLY): 26 min   Charges:   PT Evaluation $Initial PT Evaluation Tier I: 1 Procedure PT Treatments $Therapeutic Activity: 8-22 mins   PT G Codes:        Ivar DrapeStout, Darly Fails E 10/05/2015, 5:59 PM   Samul Dadauth Jessabelle Markiewicz, PT MS Acute Rehab Dept. Number: [facility] [phone] and [facility] [phone]

## 2015-10-05 NOTE — Progress Notes (Signed)
[facility] [place] [facility]   PATIENT NAME: [name]    MR#:  [id number]  DATE OF BIRTH:  1915/03/01  SUBJECTIVE:  CHIEF COMPLAINT:   Chief Complaint  Patient presents with  . Chest Pain   -Elderly patient from family care home admitted for change in mental status and poor by mouth intake. Noted to have UTI. -Patient is hard of hearing, but more alert. Still has poor appetite. -Urine cultures are pending. On Rocephin  REVIEW OF SYSTEMS:  Review of Systems  Constitutional: Negative for fever and chills.  HENT: Positive for hearing loss.   Respiratory: Positive for cough. Negative for shortness of breath and wheezing.   Cardiovascular: Negative for chest pain, palpitations and leg swelling.  Gastrointestinal: Negative for nausea, vomiting, abdominal pain, diarrhea and constipation.  Genitourinary: Positive for frequency. Negative for dysuria.  Musculoskeletal: Negative for myalgias.  Neurological: Positive for weakness. Negative for dizziness, sensory change, speech change, focal weakness, seizures and headaches.    DRUG ALLERGIES:   Allergies  Allergen Reactions  . Sulfa Antibiotics     VITALS:  Blood pressure 129/66, pulse 90, temperature 97.4 F (36.3 C), temperature source Oral, resp. rate 20, height 5\' 5"  (1.651 m), weight 53.207 kg (117 lb 4.8 oz), SpO2 95 %.  PHYSICAL EXAMINATION:  Physical Exam  GENERAL:  81 y.o.-year-old elderly patient lying in the bed with no acute distress.  EYES: Pupils equal, round, reactive to light and accommodation. No scleral icterus. Extraocular muscles intact.  HEENT: Head atraumatic, normocephalic. Oropharynx and nasopharynx clear.  NECK:  Supple, no jugular venous distention. No thyroid enlargement, no tenderness.  LUNGS: Normal breath sounds bilaterally, no wheezing, rales,rhonchi or crepitation. No use of accessory muscles of respiration. Decreased bibasilar breath sounds  noted CARDIOVASCULAR: S1, S2 normal. No  rubs, or gallops. 3/6 systolic murmur is present ABDOMEN: Soft, nontender, nondistended. Bowel sounds present. No organomegaly or mass.  EXTREMITIES: No pedal edema, cyanosis, or clubbing.  NEUROLOGIC: Cranial nerves II through XII are intact. Muscle strength 5/5 in all extremities. Sensation intact. Gait not checked.  PSYCHIATRIC: The patient is alert and oriented x 3.  SKIN: No obvious rash, lesion, or ulcer. Dry skin noted   LABORATORY PANEL:   CBC  Recent Labs Lab 10/05/15 0619  WBC 8.4  HGB 12.0  HCT 35.6  PLT 188   ------------------------------------------------------------------------------------------------------------------  Chemistries   Recent Labs Lab 10/05/15 0619  NA 144  K 3.5  CL 107  CO2 28  GLUCOSE 96  BUN 36*  CREATININE 0.64  CALCIUM 8.8*  AST 18  ALT 15  ALKPHOS 41  BILITOT 1.3*   ------------------------------------------------------------------------------------------------------------------  Cardiac Enzymes  Recent Labs Lab 10/05/15 0039  TROPONINI 0.09*   ------------------------------------------------------------------------------------------------------------------  RADIOLOGY:  Dg Chest 2 View  10/04/2015  CLINICAL DATA:  Chest pain for 2 days EXAM: CHEST - 2 VIEW COMPARISON:  04/10/2013 FINDINGS: Cardiac shadow is enlarged. Aortic calcifications are again noted. The lungs are well aerated bilaterally without focal infiltrate or sizable effusion. Mild vascular congestion interstitial change is noted consistent with mild CHF. No other focal abnormality is noted. IMPRESSION: Mild CHF. Electronically Signed   By: Alcide CleverMark  Lukens M.D.   On: 10/04/2015 09:25    EKG:   Orders placed or performed during the hospital encounter of 10/04/15  . ED EKG within 10 minutes  . ED EKG within 10 minutes  . EKG 12-Lead  . EKG 12-Lead    ASSESSMENT AND PLAN:   81 year old  female with past medical  history of chronic atrial fibrillation not on anticoagulation, hypertension, gastroesophageal reflux disease, systolic CHF with EF of 35% presents from a Family care home secondary to poor by mouth intake and also altered mental status. She is noted to have acute cystitis.  #1 acute cystitis-urine cultures are sent. -Mental status is improving. Continue Rocephin for now. No further fevers noted.  #2 metabolic encephalopathy-confusion secondary to the infection. -Continue to monitor at this time. -Speech eval requested for difficulty with water.  #3 atrial fibrillation-not on anticoagulation. -Rate slightly elevated. Continue Cardizem -Patient on low-dose stitch oxygen as well.  #4 hypertension-on Cardizem and losartan. If blood pressure is low, we'll discontinue losartan.  #5 chronic systolic CHF-known ejection fraction of 35%-. -Continue Lasix for now. We'll check basic metabolic panel while on Lasix to check the electrolytes.  #6 GERD-on protonix . #7 DVT prophylaxis-on Lovenox   Physical therapy consult requested.  All the records are reviewed and case discussed with Care Management/Social Workerr. Management plans discussed with the patient, family and they are in agreement.  CODE STATUS: DNR  TOTAL TIME TAKING CARE OF THIS PATIENT: 38 minutes.   POSSIBLE D/C IN 2 DAYS, DEPENDING ON CLINICAL CONDITION.   Enid Baas M.D on 10/05/2015 at 12:29 PM  Between 7am to 6pm - Pager - [phone]  After 6pm go to [email or web address] - password [email or web address] [facility]  [facility]  Office  [phone]  CC: Primary care physician; Sherlene Shams, MD

## 2015-10-05 NOTE — Progress Notes (Signed)
RN offered to help pt get in the chair and pt refused saying she could not sit up for a long time. Will continue to encourage movement. Also, pt has been rotated from side to side.  Karsten RoLauren E Hobbs

## 2015-10-05 NOTE — Plan of Care (Signed)
Problem: SLP Dysphagia Goals Goal: Misc Dysphagia Goal Pt will safely tolerate po diet of least restrictive consistency w/ no overt s/s of aspiration noted by Staff/pt/family x3 sessions.    

## 2015-10-05 NOTE — Progress Notes (Signed)
Initial Nutrition Assessment      INTERVENTION:  Meals and snacks: Monitor intake and tolerance Medical Nutrition Supplement therapy: will add magic cup BID for added nutrition. Dtr reports ensure causes diarrhea but can tolerate ice cream.    NUTRITION DIAGNOSIS:   Inadequate oral intake related to acute illness as evidenced by per patient/family report.    GOAL:   Patient will meet greater than or equal to 90% of their needs    MONITOR:    (Energy intake, )  REASON FOR ASSESSMENT:   Malnutrition Screening Tool    ASSESSMENT:      Pt admitted with chest pain, weakness, UTI  Past Medical History  Diagnosis Date  . Incontinence of urine     bladder tac, collagen injections (Dr. Evelene CroonWolff)  . Diverticulosis     with bleeding  . Atrial fibrillation (HCC)   . Arthritis     secondary to OA and DJD  . Hypertension   . GERD (gastroesophageal reflux disease)   . Heart murmur   . Hyperlipidemia   . CHF (congestive heart failure) (HCC)     EF 35-35% by y last ECHO  . Diverticulosis of colon     Current Nutrition: ate ice cream, applesauce and apple juice this am per dtr  Food/Nutrition-Related History: dtr reports poor po intake for the past 4 days prior to admission   Scheduled Medications:  . acetaminophen  1,000 mg Oral QPC breakfast  . aspirin  324 mg Oral Once  . cefTRIAXone (ROCEPHIN)  IV  1 g Intravenous Q24H  . digoxin  62.5 mcg Oral Daily  . diltiazem  240 mg Oral Daily  . docusate sodium  100 mg Oral BID  . enoxaparin (LOVENOX) injection  30 mg Subcutaneous Q24H  . escitalopram  10 mg Oral Daily  . furosemide  20 mg Oral Daily  . losartan  50 mg Oral Daily  . meloxicam  7.5 mg Oral Daily  . mirtazapine  45 mg Oral QHS  . pantoprazole  40 mg Oral Daily  . sodium chloride  3 mL Intravenous Q12H        Electrolyte/Renal Profile and Glucose Profile:   Recent Labs Lab 10/04/15 0859 10/05/15 0619  NA 142 144  K 3.6 3.5  CL 104 107  CO2 29  28  BUN 43* 36*  CREATININE 0.88 0.64  CALCIUM 9.4 8.8*  GLUCOSE 111* 96   Protein Profile:  Recent Labs Lab 10/05/15 0619  ALBUMIN 3.4*    Gastrointestinal Profile: Last BM:12/22   Nutrition-Focused Physical Exam Findings:  Unable to complete Nutrition-Focused physical exam at this time.  Pt sleeping this am   Weight Change: noted wt gain per wt encounters    Diet Order:  Diet Heart Room service appropriate?: Yes; Fluid consistency:: Thin  Skin:   reviewed   Height:   Ht Readings from Last 1 Encounters:  10/04/15 5\' 5"  (1.651 m)    Weight:   Wt Readings from Last 1 Encounters:  10/05/15 117 lb 4.8 oz (53.207 kg)    Ideal Body Weight:     BMI:  Body mass index is 19.52 kg/(m^2).  Estimated Nutritional Needs:   Kcal:  BEE 915 kcals (IF 1.0-1.3, AF 1.3) 5409-81191189-1546 kcals/d  Protein:  (1.0-1.2 g/kg) 53-64 g/d  Fluid:  (25-5630ml/kg) 1325-159090ml/d  EDUCATION NEEDS:   No education needs identified at this time  MODERATE Care Level  Denetta Fei B. Freida BusmanAllen, RD, LDN [phone] (pager) Weekend/On-Call pager [phone])

## 2015-10-06 LAB — BASIC METABOLIC PANEL
ANION GAP: 9 (ref 5–15)
BUN: 32 mg/dL — ABNORMAL HIGH (ref 6–20)
CALCIUM: 8.5 mg/dL — AB (ref 8.9–10.3)
CO2: 28 mmol/L (ref 22–32)
Chloride: 107 mmol/L (ref 101–111)
Creatinine, Ser: 0.66 mg/dL (ref 0.44–1.00)
GLUCOSE: 98 mg/dL (ref 65–99)
POTASSIUM: 3.5 mmol/L (ref 3.5–5.1)
Sodium: 144 mmol/L (ref 135–145)

## 2015-10-06 LAB — URINE CULTURE: Culture: >=100000

## 2015-10-06 MED ORDER — ENOXAPARIN SODIUM 40 MG/0.4ML ~~LOC~~ SOLN
40.0000 mg | SUBCUTANEOUS | Status: DC
  Administered 2015-10-06 – 2015-10-07 (×2): 40 mg via SUBCUTANEOUS
  Filled 2015-10-06 (×2): qty 0.4

## 2015-10-06 NOTE — Consult Note (Signed)
Reason for Consult: Murmur borderline troponin Referring Physician: Dr. Margaretmary Eddy hospitalist  [name] is an 81 y.o. female.  HPI: Patient presented with urosepsis mild altered mental status weakness fatigue. Patient has significant murmur. Known history of congestive heart failure. Patient complains of mild minimal shortness of breath but denies chest pain. Patient was transferred from family care home because of altered mental status mild history. She was found to have borderline troponin. Denies any leg swelling no blackout spells or syncope.  Past Medical History  Diagnosis Date  . Incontinence of urine     bladder tac, collagen injections (Dr. Yves Dill)  . Diverticulosis     with bleeding  . Atrial fibrillation ([place])   . Arthritis     secondary to OA and DJD  . Hypertension   . GERD (gastroesophageal reflux disease)   . Heart murmur   . Hyperlipidemia   . CHF (congestive heart failure) (HCC)     EF 35-35% by y last ECHO  . Diverticulosis of colon     Past Surgical History  Procedure Laterality Date  . Cholecystectomy    . Laminectomy    . Shoulder hemi-arthroplasty  2010    right shoulder intramedullar rod and screw  . Spinal cord decompression      laminectomy  . Joint replacement      bilateral knee    Family History  Problem Relation Age of Onset  . Cancer Son     renal cell carcinoma    Social History:  reports that she has never smoked. She has never used smokeless tobacco. She reports that she does not drink alcohol or use illicit drugs.  Allergies:  Allergies  Allergen Reactions  . Sulfa Antibiotics     Medications: I have reviewed the patient's current medications.  Results for orders placed or performed during the hospital encounter of 10/04/15 (from the past 48 hour(s))  Lactic acid, plasma     Status: None   Collection Time: 10/04/15  1:20 PM  Result Value Ref Range   Lactic Acid, Venous 1.7 0.5 - 2.0 mmol/L  Troponin I     Status: Abnormal   Collection Time: 10/04/15  1:20 PM  Result Value Ref Range   Troponin I 0.09 (H) <0.031 ng/mL    Comment: PREVIOUS RESULT CALLED TO ALLIE RILEY @ 0940 10/04/15 BY [facility]        PERSISTENTLY INCREASED TROPONIN VALUES IN THE RANGE OF 0.04-0.49 ng/mL CAN BE SEEN IN:       -UNSTABLE ANGINA       -CONGESTIVE HEART FAILURE       -MYOCARDITIS       -CHEST TRAUMA       -ARRYHTHMIAS       -LATE PRESENTING MYOCARDIAL INFARCTION       -COPD   CLINICAL FOLLOW-UP RECOMMENDED.   Troponin I     Status: Abnormal   Collection Time: 10/04/15  6:29 PM  Result Value Ref Range   Troponin I 0.09 (H) <0.031 ng/mL    Comment: PREVIOUS RESULT CALLED AT 0940 10/04/15 BY [facility]...SDR        PERSISTENTLY INCREASED TROPONIN VALUES IN THE RANGE OF 0.04-0.49 ng/mL CAN BE SEEN IN:       -UNSTABLE ANGINA       -CONGESTIVE HEART FAILURE       -MYOCARDITIS       -CHEST TRAUMA       -ARRYHTHMIAS       -LATE PRESENTING MYOCARDIAL INFARCTION       -  COPD   CLINICAL FOLLOW-UP RECOMMENDED.   Troponin I     Status: Abnormal   Collection Time: 10/05/15 12:39 AM  Result Value Ref Range   Troponin I 0.09 (H) <0.031 ng/mL    Comment: PREVIOUS RESULT CALLED BY [facility] AT 0940 ON 10/04/15 [facility]        PERSISTENTLY INCREASED TROPONIN VALUES IN THE RANGE OF 0.04-0.49 ng/mL CAN BE SEEN IN:       -UNSTABLE ANGINA       -CONGESTIVE HEART FAILURE       -MYOCARDITIS       -CHEST TRAUMA       -ARRYHTHMIAS       -LATE PRESENTING MYOCARDIAL INFARCTION       -COPD   CLINICAL FOLLOW-UP RECOMMENDED.   Comprehensive metabolic panel     Status: Abnormal   Collection Time: 10/05/15  6:19 AM  Result Value Ref Range   Sodium 144 135 - 145 mmol/L   Potassium 3.5 3.5 - 5.1 mmol/L   Chloride 107 101 - 111 mmol/L   CO2 28 22 - 32 mmol/L   Glucose, Bld 96 65 - 99 mg/dL   BUN 36 (H) 6 - 20 mg/dL   Creatinine, Ser 0.64 0.44 - 1.00 mg/dL   Calcium 8.8 (L) 8.9 - 10.3 mg/dL   Total Protein 6.4 (L) 6.5 - 8.1 g/dL   Albumin 3.4 (L) 3.5 -  5.0 g/dL   AST 18 15 - 41 U/L   ALT 15 14 - 54 U/L   Alkaline Phosphatase 41 38 - 126 U/L   Total Bilirubin 1.3 (H) 0.3 - 1.2 mg/dL   GFR calc non Af Amer >60 >60 mL/min   GFR calc Af Amer >60 >60 mL/min    Comment: (NOTE) The eGFR has been calculated using the CKD EPI equation. This calculation has not been validated in all clinical situations. eGFR's persistently <60 mL/min signify possible Chronic Kidney Disease.    Anion gap 9 5 - 15  CBC     Status: Abnormal   Collection Time: 10/05/15  6:19 AM  Result Value Ref Range   WBC 8.4 3.6 - 11.0 K/uL   RBC 3.79 (L) 3.80 - 5.20 MIL/uL   Hemoglobin 12.0 12.0 - 16.0 g/dL   HCT 35.6 35.0 - 47.0 %   MCV 94.0 80.0 - 100.0 fL   MCH 31.7 26.0 - 34.0 pg   MCHC 33.8 32.0 - 36.0 g/dL   RDW 14.2 11.5 - 14.5 %   Platelets 188 150 - 440 K/uL  Basic metabolic panel     Status: Abnormal   Collection Time: 10/06/15  6:02 AM  Result Value Ref Range   Sodium 144 135 - 145 mmol/L   Potassium 3.5 3.5 - 5.1 mmol/L   Chloride 107 101 - 111 mmol/L   CO2 28 22 - 32 mmol/L   Glucose, Bld 98 65 - 99 mg/dL   BUN 32 (H) 6 - 20 mg/dL   Creatinine, Ser 0.66 0.44 - 1.00 mg/dL   Calcium 8.5 (L) 8.9 - 10.3 mg/dL   GFR calc non Af Amer >60 >60 mL/min   GFR calc Af Amer >60 >60 mL/min    Comment: (NOTE) The eGFR has been calculated using the CKD EPI equation. This calculation has not been validated in all clinical situations. eGFR's persistently <60 mL/min signify possible Chronic Kidney Disease.    Anion gap 9 5 - 15    No results found.  Review of Systems  Constitutional:  Positive for malaise/fatigue.  HENT: Positive for hearing loss.   Eyes: Negative.   Respiratory: Positive for shortness of breath.   Cardiovascular: Positive for orthopnea and leg swelling.  Gastrointestinal: Negative.   Genitourinary: Positive for dysuria, urgency and frequency.  Musculoskeletal: Positive for myalgias and back pain.  Skin: Negative.   Neurological: Positive  for weakness.  Endo/Heme/Allergies: Negative.   Psychiatric/Behavioral: Negative.    Blood pressure 122/62, pulse 78, temperature 97.6 F (36.4 C), temperature source Oral, resp. rate 18, height _0  (1.651 m), weight 52.799 kg (116 lb 6.4 oz), SpO2 97 %. Physical Exam  Nursing note and vitals reviewed. Constitutional: She appears well-developed and well-nourished.  HENT:  Head: Normocephalic and atraumatic.  Eyes: Conjunctivae and EOM are normal. Pupils are equal, round, and reactive to light.  Neck: Normal range of motion. Neck supple.  Cardiovascular: S1 normal, S2 normal and normal pulses.  An irregularly irregular rhythm present. Exam reveals gallop and S3.   Murmur heard.  Systolic murmur is present with a grade of 3/6    Assessment/Plan: Congestive heart failure compensated Murmur Mitral regurgitation Mitral valve prolapse Hypertension Atrial fibrillation Elevated troponin demand ischemia GERD Hyperlipidemia Urosepsis DJD . PLAN Mild chronic congestive heart failure on chest x-ray but reasonably compensated low-dose diuretics Significant murmur from mitral valve prolapse and regurgitation recommend medical therapy Rate control for atrial fibrillation with digoxin and diltiazem Supplemental oxygen as necessary Agree with broad-spectrum antibiotic therapy for urosepsis DVT prophylaxis but I do not recommend long-term anticoagulation Continue aspirin for 2 sclerotic vascular disease Low-dose meloxicam for DJD Continue Protonix therapy for GERD symptoms Lasix therapy for mitral regurgitation and mild congestive heart failure Agree with echocardiogram which shows preserved left ventricular function but severe mitral regurgitation secondary to prolapse Conservative cardiac therapy at this point  CALLWOOD,DWAYNE D. 10/06/2015, 10:31 AM

## 2015-10-06 NOTE — Progress Notes (Signed)
Anticoagulation monitoring(Lovenox):  81yo  ordered Lovenox 30 mg Q24h  Filed Weights   10/04/15 0852 10/05/15 0436 10/06/15 0500  Weight: 113 lb (51.256 kg) 117 lb 4.8 oz (53.207 kg) 116 lb 6.4 oz (52.799 kg)   BMI 18.8 Lab Results  Component Value Date   CREATININE 0.66 10/06/2015   CREATININE 0.64 10/05/2015   CREATININE 0.88 10/04/2015   Estimated Creatinine Clearance: 33.5 mL/min (by C-G formula based on Cr of 0.66). Hemoglobin & Hematocrit     Component Value Date/Time   HGB 12.0 10/05/2015 0619   HGB 13.4 02/11/2014 1613   HCT 35.6 10/05/2015 0619   HCT 39.8 02/11/2014 1613     Per Protocol for Patient with estCrcl> 30 ml/min and BMI < 40, will transition to Lovenox 40 mg Q24h.

## 2015-10-06 NOTE — Progress Notes (Signed)
Pt daughter present at bedside expressed concerns about pt poor appetite and having physical therapy work with pt, reassured pt daughter that pt does have physical therapy consult and they will also work with her.

## 2015-10-06 NOTE — Progress Notes (Signed)
CCU notified nurse at 0315 that patient had a 12 beat run of V Tach. Dr. Anne HahnWillis notified and stated to continue to monitor.

## 2015-10-06 NOTE — Progress Notes (Signed)
[facility] [place] [facility]   PATIENT NAME: [name]    MR#:  [id number]  DATE OF BIRTH:  [birth year]  SUBJECTIVE:  CHIEF COMPLAINT:   Chief Complaint  Patient presents with  . Chest Pain   -Appears much better today. Sitting and eating her breakfast. -Urine cultures are pending. Possible discharge tomorrow.  REVIEW OF SYSTEMS:  Review of Systems  Constitutional: Negative for fever and chills.  HENT: Positive for hearing loss.   Respiratory: Positive for cough. Negative for shortness of breath and wheezing.   Cardiovascular: Negative for chest pain, palpitations and leg swelling.  Gastrointestinal: Negative for nausea, vomiting, abdominal pain, diarrhea and constipation.  Genitourinary: Positive for frequency. Negative for dysuria.  Musculoskeletal: Negative for myalgias.  Neurological: Positive for weakness. Negative for dizziness, sensory change, speech change, focal weakness, seizures and headaches.    DRUG ALLERGIES:   Allergies  Allergen Reactions  . Sulfa Antibiotics     VITALS:  Blood pressure 122/62, pulse 78, temperature 97.6 F (36.4 C), temperature source Oral, resp. rate 18, height 5\' 5"  (1.651 m), weight 52.799 kg (116 lb 6.4 oz), SpO2 97 %.  PHYSICAL EXAMINATION:  Physical Exam  GENERAL:  81 y.o.-year-old elderly patient lying in the bed with no acute distress.  EYES: Pupils equal, round, reactive to light and accommodation. No scleral icterus. Extraocular muscles intact.  HEENT: Head atraumatic, normocephalic. Oropharynx and nasopharynx clear.  NECK:  Supple, no jugular venous distention. No thyroid enlargement, no tenderness.  LUNGS: Normal breath sounds bilaterally, no wheezing, rales,rhonchi or crepitation. No use of accessory muscles of respiration. Decreased bibasilar breath sounds noted CARDIOVASCULAR: S1, S2 normal. No  rubs, or gallops. 3/6 systolic murmur is present ABDOMEN: Soft, nontender, nondistended.  Bowel sounds present. No organomegaly or mass.  EXTREMITIES: No pedal edema, cyanosis, or clubbing.  NEUROLOGIC: Cranial nerves II through XII are intact. Muscle strength 5/5 in all extremities. Sensation intact. Gait not checked.  PSYCHIATRIC: The patient is alert and oriented x 3.  SKIN: No obvious rash, lesion, or ulcer. Dry skin noted   LABORATORY PANEL:   CBC  Recent Labs Lab 10/05/15 0619  WBC 8.4  HGB 12.0  HCT 35.6  PLT 188   ------------------------------------------------------------------------------------------------------------------  Chemistries   Recent Labs Lab 10/05/15 0619 10/06/15 0602  NA 144 144  K 3.5 3.5  CL 107 107  CO2 28 28  GLUCOSE 96 98  BUN 36* 32*  CREATININE 0.64 0.66  CALCIUM 8.8* 8.5*  AST 18  --   ALT 15  --   ALKPHOS 41  --   BILITOT 1.3*  --    ------------------------------------------------------------------------------------------------------------------  Cardiac Enzymes  Recent Labs Lab 10/05/15 0039  TROPONINI 0.09*   ------------------------------------------------------------------------------------------------------------------  RADIOLOGY:  No results found.  EKG:   Orders placed or performed during the hospital encounter of 10/04/15  . ED EKG within 10 minutes  . ED EKG within 10 minutes  . EKG 12-Lead  . EKG 12-Lead    ASSESSMENT AND PLAN:   81 year old female with past medical history of chronic atrial fibrillation not on anticoagulation, hypertension, gastroesophageal reflux disease, systolic CHF with EF of 35% presents from a Family care home secondary to poor by mouth intake and also altered mental status. She is noted to have acute cystitis.  #1 acute cystitis-urine cultures are sent and are pending. -Mental status is improving, at baseline now. Continue Rocephin for now. No further fevers noted.  #2 metabolic encephalopathy-confusion secondary to the infection. -  Continue to monitor at this time.  Resolved now  #3 atrial fibrillation-not on anticoagulation. -Rate better controlled. Continue Cardizem -Patient on  oxygen as well. -Elevated troponin likely demand ischemia. Appreciate cardiology consult  #4 hypertension-on Cardizem and losartan. If blood pressure is low, we'll discontinue losartan.  #5 chronic systolic CHF-known ejection fraction of 35%-. -Continue Lasix for now. We'll check basic metabolic panel while on Lasix to check the electrolytes. -Patient is weaned off oxygen now  #6 GERD-on protonix . #7 DVT prophylaxis-on Lovenox   Physical therapy consult requested.  All the records are reviewed and case discussed with Care Management/Social Workerr. Management plans discussed with the patient, family and they are in agreement.  CODE STATUS: DNR  TOTAL TIME TAKING CARE OF THIS PATIENT: 36 minutes.   POSSIBLE D/C TOMORROW, DEPENDING ON CLINICAL CONDITION.   Enid Baas M.D on 10/06/2015 at 12:29 PM  Between 7am to 6pm - Pager - [phone]  After 6pm go to [email or web address] - password [email or web address] [facility]  [facility]  Office  [phone]  CC: Primary care physician; Sherlene Shams, MD

## 2015-10-06 NOTE — Progress Notes (Signed)
Subjective:  Patient states to be doing reasonably well mild shortness of breath no chest pain still complaining of incontinence denies fever chills or sweats.  Objective:  Vital Signs in the last 24 hours: Temp:  [97.6 F (36.4 C)-97.7 F (36.5 C)] 97.6 F (36.4 C) (12/25 0555) Pulse Rate:  [73-86] 78 (12/25 0951) Resp:  [17-18] 18 (12/25 0555) BP: (114-137)/(51-70) 122/62 mmHg (12/25 0555) SpO2:  [96 %-99 %] 97 % (12/25 0555) Weight:  [52.799 kg (116 lb 6.4 oz)] 52.799 kg (116 lb 6.4 oz) (12/25 0500)  Intake/Output from previous day: 12/24 0701 - 12/25 0700 In: 640 [P.O.:640] Out: 800 [Urine:800] Intake/Output from this shift:    Physical Exam: General appearance: alert, cooperative and appears stated age Neck: no adenopathy, no carotid bruit, no JVD, supple, symmetrical, trachea midline and thyroid not enlarged, symmetric, no tenderness/mass/nodules Lungs: clear to auscultation bilaterally Heart: irregularly irregular rhythm and systolic murmur: holosystolic 3/6, crescendo at lower left sternal border Abdomen: soft, non-tender; bowel sounds normal; no masses,  no organomegaly Extremities: extremities normal, atraumatic, no cyanosis or edema Pulses: 2+ and symmetric Skin: Skin color, texture, turgor normal. No rashes or lesions Neurologic: Alert and oriented X 3, normal strength and tone. Normal symmetric reflexes. Normal coordination and gait  Lab Results:  Recent Labs  10/04/15 0859 10/05/15 0619  WBC 11.5* 8.4  HGB 12.3 12.0  PLT 189 188    Recent Labs  10/05/15 0619 10/06/15 0602  NA 144 144  K 3.5 3.5  CL 107 107  CO2 28 28  GLUCOSE 96 98  BUN 36* 32*  CREATININE 0.64 0.66    Recent Labs  10/04/15 1829 10/05/15 0039  TROPONINI 0.09* 0.09*   Hepatic Function Panel  Recent Labs  10/05/15 0619  PROT 6.4*  ALBUMIN 3.4*  AST 18  ALT 15  ALKPHOS 41  BILITOT 1.3*   No results for input(s): CHOL in the last 72 hours. No results for input(s):  PROTIME in the last 72 hours.  Imaging: Imaging results have been reviewed  Cardiac Studies:  Assessment/Plan:  Arrhythmia Atrial Fibrillation Cardiomyopathy CHF Edema Shortness of Breath Valvular Disease  Urosepsis Incontinence Murmur Mitral regurgitation/MVP . PLAN Continue antibiotic therapy Low-dose diuretic therapy for mild congestive failure Demand ischemia with elevated troponin Continue blood pressure control Rate control for atrial fibrillation with digoxin and diltiazem Pain control with meloxicam Continue Protonix for GERD Recommend conservative medical therapy  LOS: 2 days    Hawke Villalpando D. 10/06/2015, 10:41 AM

## 2015-10-07 LAB — BASIC METABOLIC PANEL
ANION GAP: 10 (ref 5–15)
BUN: 24 mg/dL — ABNORMAL HIGH (ref 6–20)
CHLORIDE: 107 mmol/L (ref 101–111)
CO2: 28 mmol/L (ref 22–32)
Calcium: 8.8 mg/dL — ABNORMAL LOW (ref 8.9–10.3)
Creatinine, Ser: 0.59 mg/dL (ref 0.44–1.00)
GFR calc non Af Amer: >60 mL/min (ref >60)
Glucose, Bld: 102 mg/dL — ABNORMAL HIGH (ref 65–99)
Potassium: 3.5 mmol/L (ref 3.5–5.1)
Sodium: 145 mmol/L (ref 135–145)

## 2015-10-07 LAB — CBC
HEMATOCRIT: 35.6 % (ref 35.0–47.0)
HEMOGLOBIN: 12 g/dL (ref 12.0–16.0)
MCH: 31.2 pg (ref 26.0–34.0)
MCHC: 33.7 g/dL (ref 32.0–36.0)
MCV: 92.5 fL (ref 80.0–100.0)
Platelets: 206 10*3/uL (ref 150–440)
RBC: 3.85 MIL/uL (ref 3.80–5.20)
RDW: 14.2 % (ref 11.5–14.5)
WBC: 7.5 10*3/uL (ref 3.6–11.0)

## 2015-10-07 MED ORDER — LEVOFLOXACIN 250 MG PO TABS
250.0000 mg | ORAL_TABLET | Freq: Every day | ORAL | Status: DC
  Administered 2015-10-07 – 2015-10-08 (×2): 250 mg via ORAL
  Filled 2015-10-07 (×2): qty 1

## 2015-10-07 MED ORDER — LOSARTAN POTASSIUM 25 MG PO TABS
25.0000 mg | ORAL_TABLET | Freq: Every day | ORAL | Status: DC
Start: 2015-10-08 — End: 2015-10-08
  Administered 2015-10-08: 25 mg via ORAL
  Filled 2015-10-07: qty 1

## 2015-10-07 NOTE — Clinical Social Work Note (Signed)
CSW was able to reach [facility] at [facility] and she stated that she would be able to take patient back tomorrow. York SpanielMonica Arlo Buffone MSW,LCSW [phone]

## 2015-10-07 NOTE — Care Management Important Message (Signed)
Important Message  Patient Details  Name: [name] MRN: [id number] Date of Birth: 1914-10-19   Medicare Important Message Given:  Yes    Armend Hochstatter A, RN 10/07/2015, 10:06 AM

## 2015-10-07 NOTE — Clinical Social Work Note (Signed)
PT has come back to reassess patient per patient's daughter's request. PT is recommending home health with 24 hour supervision. Patient's daughter is happy about this because patient's daughter does not wish for patient to go to [facility] and wishes for [facility] to [facility] to  [facility]. CSW has attempted to reach Nolberto HanlonMarilyn Williams, the owner [facility], but she is not in the facility. MD states patient may be ready to discharge tomorrow. York SpanielMonica Quame Spratlin MSW,LCSW [phone]

## 2015-10-07 NOTE — Progress Notes (Signed)
[facility] [place] [facility]   PATIENT NAME: [name]    MR#:  [id number]  DATE OF BIRTH:  07-28-1915  SUBJECTIVE:  CHIEF COMPLAINT:   Chief Complaint  Patient presents with  . Chest Pain   -Much better. Awaiting physical therapy reevaluation today. -Urine cultures growing Escherichia coli. -Physical weakness persists. Daughter at bedside  REVIEW OF SYSTEMS:  Review of Systems  Constitutional: Negative for fever and chills.  HENT: Positive for hearing loss.   Respiratory: Positive for cough. Negative for shortness of breath and wheezing.   Cardiovascular: Negative for chest pain, palpitations and leg swelling.  Gastrointestinal: Negative for nausea, vomiting, abdominal pain, diarrhea and constipation.  Genitourinary: Negative for dysuria and frequency.  Musculoskeletal: Negative for myalgias.  Neurological: Positive for weakness. Negative for dizziness, sensory change, speech change, focal weakness, seizures and headaches.    DRUG ALLERGIES:   Allergies  Allergen Reactions  . Sulfa Antibiotics     VITALS:  Blood pressure 111/58, pulse 96, temperature 97.3 F (36.3 C), temperature source Oral, resp. rate 17, height 5\' 5"  (1.651 m), weight 52.799 kg (116 lb 6.4 oz), SpO2 94 %.  PHYSICAL EXAMINATION:  Physical Exam  GENERAL:  81 y.o.-year-old elderly patient lying in the bed with no acute distress.  EYES: Pupils equal, round, reactive to light and accommodation. No scleral icterus. Extraocular muscles intact.  HEENT: Head atraumatic, normocephalic. Oropharynx and nasopharynx clear. Patient is hard of hearing NECK:  Supple, no jugular venous distention. No thyroid enlargement, no tenderness.  LUNGS: Normal breath sounds bilaterally, no wheezing, rales,rhonchi or crepitation. No use of accessory muscles of respiration. Decreased bibasilar breath sounds noted CARDIOVASCULAR: S1, S2 normal. No  rubs, or gallops. 3/6 systolic murmur is  present ABDOMEN: Soft, nontender, nondistended. Bowel sounds present. No organomegaly or mass.  EXTREMITIES: No pedal edema, cyanosis, or clubbing.  NEUROLOGIC: Cranial nerves II through XII are intact. Muscle strength 5/5 in all extremities. Sensation intact. Gait not checked.  PSYCHIATRIC: The patient is alert and oriented x 3.  SKIN: No obvious rash, lesion, or ulcer. Dry skin noted   LABORATORY PANEL:   CBC  Recent Labs Lab 10/07/15 0623  WBC 7.5  HGB 12.0  HCT 35.6  PLT 206   ------------------------------------------------------------------------------------------------------------------  Chemistries   Recent Labs Lab 10/05/15 0619  10/07/15 0623  NA 144  < > 145  K 3.5  < > 3.5  CL 107  < > 107  CO2 28  < > 28  GLUCOSE 96  < > 102*  BUN 36*  < > 24*  CREATININE 0.64  < > 0.59  CALCIUM 8.8*  < > 8.8*  AST 18  --   --   ALT 15  --   --   ALKPHOS 41  --   --   BILITOT 1.3*  --   --   < > = values in this interval not displayed. ------------------------------------------------------------------------------------------------------------------  Cardiac Enzymes  Recent Labs Lab 10/05/15 0039  TROPONINI 0.09*   ------------------------------------------------------------------------------------------------------------------  RADIOLOGY:  No results found.  EKG:   Orders placed or performed during the hospital encounter of 10/04/15  . ED EKG within 10 minutes  . ED EKG within 10 minutes  . EKG 12-Lead  . EKG 12-Lead    ASSESSMENT AND PLAN:   81 year old female with past medical history of chronic atrial fibrillation not on anticoagulation, hypertension, gastroesophageal reflux disease, systolic CHF with EF of 35% presents from a Family care home secondary  to poor by mouth intake and also altered mental status. She is noted to have acute cystitis.  #1 acute cystitis-urine cultures are sent and growing Escherichia coli. -Was on Rocephin. Encephalopathy  resolved. We will change to oral Levaquin.  -No further fevers noted.  #2 metabolic encephalopathy-confusion secondary to the infection. -Continue to monitor at this time. Resolved now  #3 atrial fibrillation-not on anticoagulation. -Rate better controlled. Continue Cardizem -Elevated troponin likely demand ischemia. Appreciate cardiology consult  #4 hypertension-on Cardizem and losartan. If blood pressure is low, decrease losartan dose.  #5 chronic systolic CHF-known ejection fraction of 35%-. -Continue oral Lasix for now.  -Patient is weaned off oxygen now  #6 GERD-on protonix . #7 DVT prophylaxis-on Lovenox   Physical therapy consult requested. Patient will likely return back to her family care home  All the records are reviewed and case discussed with Care Management/Social Workerr. Management plans discussed with the patient, family and they are in agreement.  CODE STATUS: DNR  TOTAL TIME TAKING CARE OF THIS PATIENT: 36 minutes.   POSSIBLE D/C TOMORROW, DEPENDING ON CLINICAL CONDITION.   Enid Baas M.D on 10/07/2015 at 2:40 PM  Between 7am to 6pm - Pager - [phone]  After 6pm go to [email or web address] - password [email or web address] [facility]  [place]  Office  [phone]  CC: Primary care physician; Sherlene Shams, MD

## 2015-10-07 NOTE — Progress Notes (Signed)
IV infiltrated okay per Dr. Nemiah CommanderKalisetti to leave IV out.

## 2015-10-07 NOTE — Progress Notes (Signed)
Physical Therapy Treatment Patient Details Name: [name] MRN: [id number] DOB: September 15, 1915 Today's Date: 10/07/2015    History of Present Illness 81 yo female wiht onset of generalized weakness after UTI leading to sepsis.  CHF, SIRS, leukocytosis issues.  Has ALF family care home placement and will benefit from SNF    PT Comments    Patient is a 81 y.o. Female who is also HOH. Very motivated to work with PT. Per patient's daughter, patient lives in ALF where 24 hour assistance is available. Previously patient was ambulating with RW to kitchen/bathroom about 3x/day. At today's treatment session, patient unable to ambulate due to generalized weakness and decreased balance. Worked on sit to stand transfer and transfer to[name]. Patient requires cues to improve posture to assist with functional mobility. Per discussion with patient's daughter, SNF is not an option. Patient and patient's daughter agreeable to HHPT plus 24 hour assistance to allow her to return to ALF. Patient will continue to benefit from functional strength and balance exercises to return to PLOF.  Follow Up Recommendations  Supervision/Assistance - 24 hour;Home health PT;Other (comment) (ALF with 24 hour assistance plus HHPT)     Equipment Recommendations  Rolling walker with 5" wheels    Recommendations for Other Services       Precautions / Restrictions Precautions Precautions: Fall Restrictions Weight Bearing Restrictions: No    Mobility  Bed Mobility Overal bed mobility: Needs Assistance Bed Mobility: Supine to Sit;Sit to Supine     Supine to sit: Min assist Sit to supine: Mod assist   General bed mobility comments: Patient requires minimal assistance scooting to EOB. Moderate assistance to move B LEs back onto bed.  Transfers Overall transfer level: Needs assistance Equipment used: Rolling walker (2 wheeled) Transfers: Sit to/from Stand Sit to Stand: Max assist         General transfer  comment: Patient has tendency to forward lean, lacking gluteal engagement. Strong posterior lean. Able to push through UEs with cues.  Ambulation/Gait                 Stairs            Wheelchair Mobility    Modified Rankin (Stroke Patients Only)       Balance Overall balance assessment: Needs assistance Sitting-balance support: Bilateral upper extremity supported Sitting balance-Leahy Scale: Fair Sitting balance - Comments: Decreased trunk musculature endurance   Standing balance support: Bilateral upper extremity supported Standing balance-Leahy Scale: Poor Standing balance comment: Increased forward trunk lean; poor weightshifting                    Cognition Arousal/Alertness: Awake/alert Behavior During Therapy: WFL for tasks assessed/performed Overall Cognitive Status: Within Functional Limits for tasks assessed       Memory: Decreased short-term memory              Exercises General Exercises - Lower Extremity Ankle Circles/Pumps: AROM;10 reps Hip ABduction/ADduction: AROM;10 reps Other Exercises Other Exercises: Sit to stand transfer x6; Bed to [facility] transfer; BSC to bed transfer    General Comments        Pertinent Vitals/Pain Pain Assessment: No/denies pain    Home Living                      Prior Function            PT Goals (current goals can now be found in the care plan section) Acute Rehab PT Goals  Patient Stated Goal: "To go to ALF" PT Goal Formulation: With patient/family Time For Goal Achievement: 10/19/15 Potential to Achieve Goals: Good Progress towards PT goals: Progressing toward goals    Frequency  Min 2X/week    PT Plan Discharge plan needs to be updated    Co-evaluation             End of Session Equipment Utilized During Treatment: Gait belt Activity Tolerance: Patient limited by fatigue Patient left: in bed;with call bell/phone within reach;with bed alarm set;with family/visitor  present     Time: 1130-1155 PT Time Calculation (min) (ACUTE ONLY): 25 min  Charges:  $Therapeutic Activity: 23-37 mins                    G Codes:      [name], PT, DPT 10/07/2015, 12:27 PM

## 2015-10-08 ENCOUNTER — Telehealth: Payer: Self-pay | Admitting: *Deleted

## 2015-10-08 DIAGNOSIS — N3000 Acute cystitis without hematuria: Secondary | ICD-10-CM

## 2015-10-08 DIAGNOSIS — I5041 Acute combined systolic (congestive) and diastolic (congestive) heart failure: Secondary | ICD-10-CM

## 2015-10-08 MED ORDER — LEVOFLOXACIN 250 MG PO TABS: 250.0000 mg | ORAL_TABLET | Freq: Every day | ORAL | Status: AC

## 2015-10-08 MED ORDER — LOSARTAN POTASSIUM 25 MG PO TABS: 25.0000 mg | ORAL_TABLET | Freq: Every day | ORAL | Status: DC

## 2015-10-08 MED ORDER — MEGESTROL ACETATE 400 MG/10ML PO SUSP: 400.0000 mg | Freq: Every day | ORAL | Status: AC

## 2015-10-08 NOTE — Telephone Encounter (Signed)
Hospice referral made.Marland Kitchen.   Hospital follow up not necessary until she is more mobile.

## 2015-10-08 NOTE — Telephone Encounter (Signed)
Will continue to follow up with TCM call and appointment.

## 2015-10-08 NOTE — Clinical Social Work Note (Signed)
Patient to discharge to return to her family care home today. Patient's daughter aware and states she will transport patient today via car. Patient's packet and fl2 prepared. Nurse aware and will facilitate discharge when time. York SpanielMonica Mikena Masoner MSW,LCSW [phone]

## 2015-10-08 NOTE — Telephone Encounter (Signed)
Patient [facility]([phone]). The administrator of the facility requested a referral to hospice. The administrator also requested that the hospital follow, be scheduled once patient becomes mobile, due to a weak heart.  Please advise

## 2015-10-08 NOTE — Discharge Summary (Signed)
[facility] [place] [facility]   PATIENT NAME: [name]    MR#:  [id number]  DATE OF BIRTH:  [birth year]  DATE OF ADMISSION:  10/04/2015 ADMITTING PHYSICIAN: Ramonita LabAruna Gouru, MD  DATE OF DISCHARGE: 10/08/2015  PRIMARY CARE PHYSICIAN: Sherlene ShamsULLO, TERESA L, MD    ADMISSION DIAGNOSIS:  Chest pain  DISCHARGE DIAGNOSIS:  Principal Problem:   Acute cystitis   SECONDARY DIAGNOSIS:   Past Medical History  Diagnosis Date  . Incontinence of urine     bladder tac, collagen injections (Dr. Evelene CroonWolff)  . Diverticulosis     with bleeding  . Atrial fibrillation (HCC)   . Arthritis     secondary to OA and DJD  . Hypertension   . GERD (gastroesophageal reflux disease)   . Heart murmur   . Hyperlipidemia   . CHF (congestive heart failure) (HCC)     EF 35-35% by y last ECHO  . Diverticulosis of colon     HOSPITAL COURSE:   81 year old female with past medical history of chronic atrial fibrillation not on anticoagulation, hypertension, gastroesophageal reflux disease, systolic CHF with EF of 35% presents from a Family care home secondary to poor by mouth intake and also altered mental status. She is noted to have acute cystitis.  #1 acute cystitis-urine cultures are sent and growing Escherichia coli. -Was on Rocephin. Encephalopathy resolved. Changed to oral Levaquin.  -No further fevers noted.  #2 metabolic encephalopathy-confusion secondary to the infection. -Continue to monitor at this time. Resolved now  #3 atrial fibrillation-not on anticoagulation. -Rate better controlled. Continue Cardizem -Elevated troponin likely demand ischemia. Appreciate cardiology consult  #4 hypertension-on Cardizem and losartan. Losartan dose has been decreased.  #5 chronic systolic CHF-known ejection fraction of 35%-. -Continue oral Lasix for now.  -Patient is weaned off oxygen now  #6 GERD-on protonix  NOTE: Daughter wanted to change patient's CODE STATUS to full  code at the time of discharge. Daughter is the healthcare power of attorney. Order placed to  Reflect the above changes.   Physical therapy consult requested. Patient will likely return back to her family care home today. Will need home health, generalized weakness noted.   DISCHARGE CONDITIONS:   Guarded  CONSULTS OBTAINED:  Treatment Team:  Alwyn Peawayne D Callwood, MD  DRUG ALLERGIES:   Allergies  Allergen Reactions  . Sulfa Antibiotics     DISCHARGE MEDICATIONS:   Current Discharge Medication List    START taking these medications   Details  levofloxacin (LEVAQUIN) 250 MG tablet Take 1 tablet (250 mg total) by mouth daily. Qty: 3 tablet, Refills: 0      CONTINUE these medications which have CHANGED   Details  losartan (COZAAR) 25 MG tablet Take 1 tablet (25 mg total) by mouth daily. Qty: 30 tablet, Refills: 2      CONTINUE these medications which have NOT CHANGED   Details  acetaminophen (TYLENOL) 500 MG tablet Take 1,000 mg by mouth daily after breakfast.    Calcium Carbonate-Vitamin D 600-400 MG-UNIT per tablet TAKE 1 TABLET BY MOUTH 2 TIMES A DAY WITH MEALS. Qty: 60 tablet, Refills: 6    digoxin (LANOXIN) 0.125 MG tablet TAKE 1/2 TABLET BY MOUTH EACH MORNING FOR HEART Qty: 15 tablet, Refills: 5    diltiazem (CARDIZEM CD) 240 MG 24 hr capsule TAKE ONE CAPSULE BY MOUTH ONCE DAILY FORHEART AND BLOOD PRESSURE (HIGH) Qty: 30 capsule, Refills: 2    escitalopram (LEXAPRO) 10 MG tablet TAKE 1 TABLET BY MOUTH ONCE DAILY  FOR DEPRESSION. Qty: 30 tablet, Refills: 11    furosemide (LASIX) 20 MG tablet TAKE 1 TABLET BY MOUTH ONCE DAILY FOR EDEMA Qty: 90 tablet, Refills: 3    meloxicam (MOBIC) 7.5 MG tablet Take 1 tablet (7.5 mg total) by mouth daily. Qty: 90 tablet, Refills: 1    mirtazapine (REMERON) 45 MG tablet Take 1 tablet (45 mg total) by mouth at bedtime. Qty: 90 tablet, Refills: 1    omeprazole (PRILOSEC) 40 MG capsule TAKE ONE CAPSULE BY MOUTH ONCE DAILY FOR  GERD Qty: 30 capsule, Refills: 2    Vitamin D, Ergocalciferol, (DRISDOL) 50000 UNITS CAPS capsule TAKE 1 CAPSULE BY MOUTH ONCE WEEKLY FOR SUPPLEMENT Qty: 12 capsule, Refills: 3         DISCHARGE INSTRUCTIONS:   1. PCP f/u in 1-2 weeks 2. Home health  If you experience worsening of your admission symptoms, develop shortness of breath, life threatening emergency, suicidal or homicidal thoughts you must seek medical attention immediately by calling 911 or calling your MD immediately  if symptoms less severe.  You Must read complete instructions/literature along with all the possible adverse reactions/side effects for all the Medicines you take and that have been prescribed to you. Take any new Medicines after you have completely understood and accept all the possible adverse reactions/side effects.   Please note  You were cared for by a hospitalist during your hospital stay. If you have any questions about your discharge medications or the care you received while you were in the hospital after you are discharged, you can call the unit and asked to speak with the hospitalist on call if the hospitalist that took care of you is not available. Once you are discharged, your primary care physician will handle any further medical issues. Please note that NO REFILLS for any discharge medications will be authorized once you are discharged, as it is imperative that you return to your primary care physician (or establish a relationship with a primary care physician if you do not have one) for your aftercare needs so that they can reassess your need for medications and monitor your lab values.    Today   CHIEF COMPLAINT:   Chief Complaint  Patient presents with  . Chest Pain    VITAL SIGNS:  Blood pressure 132/72, pulse 97, temperature 97.4 F (36.3 C), temperature source Oral, resp. rate 17, height  (1.651 m), weight 52.345 kg (115 lb 6.4 oz), SpO2 96 %.  I/O:   Intake/Output Summary  (Last 24 hours) at 10/08/15 0908 Last data filed at 10/08/15 0451  Gross per 24 hour  Intake    480 ml  Output      0 ml  Net    480 ml    PHYSICAL EXAMINATION:   Physical Exam  GENERAL: 81 y.o.-year-old elderly patient lying in the bed with no acute distress.  EYES: Pupils equal, round, reactive to light and accommodation. No scleral icterus. Extraocular muscles intact.  HEENT: Head atraumatic, normocephalic. Oropharynx and nasopharynx clear. Patient is hard of hearing NECK: Supple, no jugular venous distention. No thyroid enlargement, no tenderness.  LUNGS: Normal breath sounds bilaterally, no wheezing, rales,rhonchi or crepitation. No use of accessory muscles of respiration. Decreased bibasilar breath sounds noted CARDIOVASCULAR: S1, S2 normal. No rubs, or gallops. 3/6 systolic murmur is present ABDOMEN: Soft, nontender, nondistended. Bowel sounds present. No organomegaly or mass.  EXTREMITIES: No pedal edema, cyanosis, or clubbing.  NEUROLOGIC: Cranial nerves II through XII are intact. Muscle strength  5/5 in all extremities. Sensation intact. Gait not checked.  PSYCHIATRIC: The patient is alert and oriented x 3.  SKIN: No obvious rash, lesion, or ulcer. Dry skin noted  DATA REVIEW:   CBC  Recent Labs Lab 10/07/15 0623  WBC 7.5  HGB 12.0  HCT 35.6  PLT 206    Chemistries   Recent Labs Lab 10/05/15 0619  10/07/15 0623  NA 144  < > 145  K 3.5  < > 3.5  CL 107  < > 107  CO2 28  < > 28  GLUCOSE 96  < > 102*  BUN 36*  < > 24*  CREATININE 0.64  < > 0.59  CALCIUM 8.8*  < > 8.8*  AST 18  --   --   ALT 15  --   --   ALKPHOS 41  --   --   BILITOT 1.3*  --   --   < > = values in this interval not displayed.  Cardiac Enzymes  Recent Labs Lab 10/05/15 0039  TROPONINI 0.09*    Microbiology Results  Results for orders placed or performed during the hospital encounter of 10/04/15  Urine culture     Status: None   Collection Time: 10/04/15  9:06 AM   Result Value Ref Range Status   Specimen Description URINE, RANDOM  Final   Special Requests NONE  Final   Culture >=100,000 COLONIES/mL ESCHERICHIA COLI  Final   Report Status 10/06/2015 FINAL  Final   Organism ID, Bacteria ESCHERICHIA COLI  Final      Susceptibility   Escherichia coli - MIC*    AMPICILLIN <=2 SENSITIVE Sensitive     CEFTAZIDIME <=1 SENSITIVE Sensitive     CEFAZOLIN <=4 SENSITIVE Sensitive     CEFTRIAXONE <=1 SENSITIVE Sensitive     CIPROFLOXACIN <=0.25 SENSITIVE Sensitive     GENTAMICIN <=1 SENSITIVE Sensitive     IMIPENEM <=0.25 SENSITIVE Sensitive     TRIMETH/SULFA <=20 SENSITIVE Sensitive     NITROFURANTOIN Value in next row Sensitive      SENSITIVE<=16    PIP/TAZO Value in next row Sensitive      SENSITIVE<=4    LEVOFLOXACIN Value in next row Sensitive      SENSITIVE<=0.12    * >=100,000 COLONIES/mL ESCHERICHIA COLI    RADIOLOGY:  No results found.  EKG:   Orders placed or performed during the hospital encounter of 10/04/15  . ED EKG within 10 minutes  . ED EKG within 10 minutes  . EKG 12-Lead  . EKG 12-Lead      Management plans discussed with the patient, family and they are in agreement.  CODE STATUS:  Full Code    Code Status Orders        Start    Ordered   10/04/15 1227   10/04/15 1226      TOTAL TIME TAKING CARE OF THIS PATIENT: 37 minutes.    Enid Baas M.D on 10/08/2015 at 9:08 AM  Between 7am to 6pm - Pager - [phone]  After 6pm go to [email or web address] - password [email or web address] [facility]  [facility]  Office  [phone]  CC: Primary care physician; Sherlene Shams, MD

## 2015-10-08 NOTE — Progress Notes (Signed)
Patient discharge teaching given, including activity, diet, follow-up appoints, and medications. Patient verbalized understanding of all discharge instructions. IV access was d/c'd. Vitals are stable. Skin is intact except as charted in most recent assessments. Pt to be escorted out by volunteer, to be driven to family care home by family.

## 2015-10-08 NOTE — [place] FL2 (Signed)
[place] MEDICAID FL2 LEVEL OF CARE SCREENING TOOL     IDENTIFICATION  Patient Name: [name] Birthdate: October 26, 1914 Sex: female Admission Date (Current Location): 10/04/2015  [facility] [place]   Facility and Address:  [facility], [address], [place], [place] [address]      Provider Number: [id number]  Attending Physician Name and Address:  Enid Baasadhika Kalisetti, MD  Relative Name and Phone Number:       Current Level of Care:  (Family Care Home) Recommended Level of Care: Family Care Home Prior Approval Number:    Date Approved/Denied:   PASRR Number:    Discharge Plan:  [facility](Family Care Home)    Current Diagnoses: Patient Active Problem List   Diagnosis Date Noted  . Acute cystitis 10/08/2015  . OA (osteoarthritis) of knee 09/11/2014  . Bilateral hearing loss due to cerumen impaction 06/23/2014  . Major depression, melancholic type (HCC) 01/01/2014  . Failure to thrive in adult 12/03/2013  . Chronic right hip pain 05/05/2013  . Adnexal mass 05/05/2013  . Vitamin D deficiency 02/11/2013  . Constipation 04/09/2012  . Chronic atrial fibrillation (HCC) 07/26/2011  . Congestive heart failure with left ventricular systolic dysfunction (HCC) 07/26/2011  . Hypertension   . GERD (gastroesophageal reflux disease)   . Heart murmur   . Hyperlipidemia   . Diverticulosis of colon     Orientation RESPIRATION BLADDER Height & Weight    Self  Normal Continent   113 lbs.  BEHAVIORAL SYMPTOMS/MOOD NEUROLOGICAL BOWEL NUTRITION STATUS   (none)  (none) Continent Diet  AMBULATORY STATUS COMMUNICATION OF NEEDS Skin   Supervision Verbally Normal                       Personal Care Assistance Level of Assistance  Dressing, Bathing Bathing Assistance: Limited assistance   Dressing Assistance: Limited assistance     Functional Limitations Info             SPECIAL CARE FACTORS FREQUENCY  PT (By licensed PT)                     Contractures Contractures Info: Not present    Additional Factors Info                  DISCHARGE MEDICATIONS:   Current Discharge Medication List    START taking these medications   Details  levofloxacin (LEVAQUIN) 250 MG tablet Take 1 tablet (250 mg total) by mouth daily. Qty: 3 tablet, Refills: 0      CONTINUE these medications which have CHANGED   Details  losartan (COZAAR) 25 MG tablet Take 1 tablet (25 mg total) by mouth daily. Qty: 30 tablet, Refills: 2      CONTINUE these medications which have NOT CHANGED   Details  acetaminophen (TYLENOL) 500 MG tablet Take 1,000 mg by mouth daily after breakfast.    Calcium Carbonate-Vitamin D 600-400 MG-UNIT per tablet TAKE 1 TABLET BY MOUTH 2 TIMES A DAY WITH MEALS. Qty: 60 tablet, Refills: 6    digoxin (LANOXIN) 0.125 MG tablet TAKE 1/2 TABLET BY MOUTH EACH MORNING FOR HEART Qty: 15 tablet, Refills: 5    diltiazem (CARDIZEM CD) 240 MG 24 hr capsule TAKE ONE CAPSULE BY MOUTH ONCE DAILY FORHEART AND BLOOD PRESSURE (HIGH) Qty: 30 capsule, Refills: 2    escitalopram (LEXAPRO) 10 MG tablet TAKE 1 TABLET BY MOUTH ONCE DAILY FOR DEPRESSION. Qty: 30 tablet, Refills: 11  furosemide (LASIX) 20 MG tablet TAKE 1 TABLET BY MOUTH ONCE DAILY FOR EDEMA Qty: 90 tablet, Refills: 3    meloxicam (MOBIC) 7.5 MG tablet Take 1 tablet (7.5 mg total) by mouth daily. Qty: 90 tablet, Refills: 1    mirtazapine (REMERON) 45 MG tablet Take 1 tablet (45 mg total) by mouth at bedtime. Qty: 90 tablet, Refills: 1    omeprazole (PRILOSEC) 40 MG capsule TAKE ONE CAPSULE BY MOUTH ONCE DAILY FOR GERD Qty: 30 capsule, Refills: 2    Vitamin D, Ergocalciferol, (DRISDOL) 50000 UNITS CAPS capsule TAKE 1 CAPSULE BY MOUTH ONCE WEEKLY FOR SUPPLEMENT Qty: 12 capsule, Refills: 3         DISCHARGE INSTRUCTIONS:         Discharge Medications: Please see discharge  summary for a list of discharge medications.  Relevant Imaging Results:  Relevant Lab Results:   Additional Information    York Spaniel, LCSW

## 2015-10-08 NOTE — Telephone Encounter (Signed)
Please advise 

## 2015-10-08 NOTE — Telephone Encounter (Signed)
PCP to notify

## 2015-10-08 NOTE — Telephone Encounter (Signed)
Will have to be seen either next week or be seen by another provider.

## 2015-10-08 NOTE — Telephone Encounter (Signed)
[facility] will [facility] today to,home. Patient was admitted with chest pain and acute cystitis. Please advise a place on Dr. Melina Schoolsullo's schedule to place patient.

## 2015-10-09 ENCOUNTER — Telehealth: Payer: Self-pay

## 2015-10-09 NOTE — Telephone Encounter (Signed)
Opened in error

## 2015-10-11 ENCOUNTER — Telehealth: Payer: Self-pay | Admitting: *Deleted

## 2015-10-11 NOTE — Telephone Encounter (Signed)
Fax number [phone], sorry about that.

## 2015-10-11 NOTE — Telephone Encounter (Signed)
Information printed, called Cory back, I need a fax number to send it to.  Please advise if her returns my call. Thanks

## 2015-10-11 NOTE — Telephone Encounter (Signed)
Faxed requested information.

## 2015-10-11 NOTE — Telephone Encounter (Signed)
Kandee KeenCory from [facility] has requested patient's  Echocardiogram results from 10/04/15, also the last two, completed office progress. notes.

## 2015-10-24 ENCOUNTER — Telehealth: Payer: Self-pay | Admitting: Internal Medicine

## 2015-10-24 DIAGNOSIS — Z515 Encounter for palliative care: Secondary | ICD-10-CM

## 2015-10-24 NOTE — Telephone Encounter (Signed)
Spoke with the Caregiver.  Patient was recently hospitalized and per the family and her conversations with the patient based on outcomes would like to have [facility] referral completed ,which is a entity of the hospice home? The patient has been doing well since discharge, but would like the referral in place if needed.  If you have questions please let me know.   Please advise?

## 2015-10-24 NOTE — Telephone Encounter (Signed)
Pt primary care giver called stating she needs a referral to [facility] hospice in [place] [phone]. Call pt @ [phone]. Thank You!

## 2015-10-25 NOTE — Telephone Encounter (Signed)
Your referral is in process as requested.

## 2015-10-26 ENCOUNTER — Emergency Department: Payer: Commercial Managed Care - HMO

## 2015-10-26 ENCOUNTER — Encounter: Payer: Self-pay | Admitting: Emergency Medicine

## 2015-10-26 ENCOUNTER — Inpatient Hospital Stay
Admission: EM | Admit: 2015-10-26 | Discharge: 2015-10-29 | DRG: 293 | Disposition: A | Discharge status: Home / Self Care | Payer: Commercial Managed Care - HMO | Attending: Internal Medicine | Admitting: Internal Medicine

## 2015-10-26 DIAGNOSIS — R5383 Other fatigue: Secondary | ICD-10-CM | POA: Diagnosis present

## 2015-10-26 DIAGNOSIS — F329 Major depressive disorder, single episode, unspecified: Secondary | ICD-10-CM | POA: Diagnosis present

## 2015-10-26 DIAGNOSIS — G8929 Other chronic pain: Secondary | ICD-10-CM | POA: Diagnosis present

## 2015-10-26 DIAGNOSIS — M179 Osteoarthritis of knee, unspecified: Secondary | ICD-10-CM | POA: Diagnosis present

## 2015-10-26 DIAGNOSIS — F0390 Unspecified dementia without behavioral disturbance: Secondary | ICD-10-CM | POA: Diagnosis present

## 2015-10-26 DIAGNOSIS — R011 Cardiac murmur, unspecified: Secondary | ICD-10-CM | POA: Diagnosis present

## 2015-10-26 DIAGNOSIS — Z79899 Other long term (current) drug therapy: Secondary | ICD-10-CM | POA: Diagnosis not present

## 2015-10-26 DIAGNOSIS — I110 Hypertensive heart disease with heart failure: Principal | ICD-10-CM | POA: Diagnosis present

## 2015-10-26 DIAGNOSIS — R4182 Altered mental status, unspecified: Secondary | ICD-10-CM | POA: Diagnosis present

## 2015-10-26 DIAGNOSIS — Z886 Allergy status to analgesic agent status: Secondary | ICD-10-CM | POA: Diagnosis not present

## 2015-10-26 DIAGNOSIS — Z96653 Presence of artificial knee joint, bilateral: Secondary | ICD-10-CM | POA: Diagnosis present

## 2015-10-26 DIAGNOSIS — I509 Heart failure, unspecified: Secondary | ICD-10-CM

## 2015-10-26 DIAGNOSIS — Z882 Allergy status to sulfonamides status: Secondary | ICD-10-CM

## 2015-10-26 DIAGNOSIS — R32 Unspecified urinary incontinence: Secondary | ICD-10-CM | POA: Diagnosis present

## 2015-10-26 DIAGNOSIS — Z66 Do not resuscitate: Secondary | ICD-10-CM | POA: Diagnosis present

## 2015-10-26 DIAGNOSIS — I482 Chronic atrial fibrillation: Secondary | ICD-10-CM | POA: Diagnosis present

## 2015-10-26 DIAGNOSIS — H9193 Unspecified hearing loss, bilateral: Secondary | ICD-10-CM | POA: Diagnosis present

## 2015-10-26 DIAGNOSIS — E785 Hyperlipidemia, unspecified: Secondary | ICD-10-CM | POA: Diagnosis present

## 2015-10-26 DIAGNOSIS — R0602 Shortness of breath: Secondary | ICD-10-CM

## 2015-10-26 DIAGNOSIS — E559 Vitamin D deficiency, unspecified: Secondary | ICD-10-CM | POA: Diagnosis present

## 2015-10-26 DIAGNOSIS — K219 Gastro-esophageal reflux disease without esophagitis: Secondary | ICD-10-CM | POA: Diagnosis present

## 2015-10-26 DIAGNOSIS — I5033 Acute on chronic diastolic (congestive) heart failure: Secondary | ICD-10-CM | POA: Diagnosis present

## 2015-10-26 DIAGNOSIS — I959 Hypotension, unspecified: Secondary | ICD-10-CM | POA: Diagnosis present

## 2015-10-26 LAB — URINALYSIS COMPLETE WITH MICROSCOPIC (ARMC ONLY)
Bilirubin Urine: NEGATIVE
GLUCOSE, UA: NEGATIVE mg/dL
Ketones, ur: NEGATIVE mg/dL
Leukocytes, UA: NEGATIVE
Nitrite: NEGATIVE
Protein, ur: 100 mg/dL — AB
Specific Gravity, Urine: 1.023 (ref 1.005–1.030)
pH: 5 (ref 5.0–8.0)

## 2015-10-26 LAB — CBC WITH DIFFERENTIAL/PLATELET
BASOS ABS: 0.1 10*3/uL (ref 0–0.1)
BASOS PCT: 1 %
EOS PCT: 0 %
Eosinophils Absolute: 0 10*3/uL (ref 0–0.7)
HCT: 38.8 % (ref 35.0–47.0)
Hemoglobin: 12.7 g/dL (ref 12.0–16.0)
Lymphocytes Relative: 9 %
Lymphs Abs: 0.7 10*3/uL — ABNORMAL LOW (ref 1.0–3.6)
MCH: 30.6 pg (ref 26.0–34.0)
MCHC: 32.8 g/dL (ref 32.0–36.0)
MCV: 93.4 fL (ref 80.0–100.0)
MONO ABS: 0.8 10*3/uL (ref 0.2–0.9)
Monocytes Relative: 10 %
Neutro Abs: 6.4 10*3/uL (ref 1.4–6.5)
Neutrophils Relative %: 80 %
PLATELETS: 192 10*3/uL (ref 150–440)
RBC: 4.15 MIL/uL (ref 3.80–5.20)
RDW: 15.3 % — AB (ref 11.5–14.5)
WBC: 7.9 10*3/uL (ref 3.6–11.0)

## 2015-10-26 LAB — MAGNESIUM: MAGNESIUM: 1.8 mg/dL (ref 1.7–2.4)

## 2015-10-26 LAB — BASIC METABOLIC PANEL
ANION GAP: 12 (ref 5–15)
BUN: 22 mg/dL — ABNORMAL HIGH (ref 6–20)
CALCIUM: 9.2 mg/dL (ref 8.9–10.3)
CO2: 25 mmol/L (ref 22–32)
CREATININE: 0.8 mg/dL (ref 0.44–1.00)
Chloride: 106 mmol/L (ref 101–111)
GFR, EST NON AFRICAN AMERICAN: 59 mL/min — AB (ref >60)
GLUCOSE: 111 mg/dL — AB (ref 65–99)
Potassium: 3.8 mmol/L (ref 3.5–5.1)
Sodium: 143 mmol/L (ref 135–145)

## 2015-10-26 LAB — LACTIC ACID, PLASMA: LACTIC ACID, VENOUS: 1.4 mmol/L (ref 0.5–2.0)

## 2015-10-26 LAB — BRAIN NATRIURETIC PEPTIDE: B NATRIURETIC PEPTIDE 5: 714 pg/mL — AB (ref 0.0–100.0)

## 2015-10-26 LAB — TROPONIN I: Troponin I: 0.06 ng/mL — ABNORMAL HIGH (ref <0.031)

## 2015-10-26 MED ORDER — LOSARTAN POTASSIUM 25 MG PO TABS
25.0000 mg | ORAL_TABLET | Freq: Every day | ORAL | Status: DC
  Filled 2015-10-26: qty 1

## 2015-10-26 MED ORDER — MELOXICAM 7.5 MG PO TABS
7.5000 mg | ORAL_TABLET | Freq: Every day | ORAL | Status: DC
  Administered 2015-10-27 – 2015-10-29 (×3): 7.5 mg via ORAL
  Filled 2015-10-26 (×3): qty 1

## 2015-10-26 MED ORDER — DIGOXIN 125 MCG PO TABS
0.1250 mg | ORAL_TABLET | Freq: Every day | ORAL | Status: DC
  Filled 2015-10-26: qty 1

## 2015-10-26 MED ORDER — ENOXAPARIN SODIUM 100 MG/ML ~~LOC~~ SOLN
SUBCUTANEOUS | Status: AC
  Administered 2015-10-26: 40 mg via SUBCUTANEOUS
  Filled 2015-10-26: qty 1

## 2015-10-26 MED ORDER — DIGOXIN 125 MCG PO TABS
0.0625 mg | ORAL_TABLET | Freq: Every day | ORAL | Status: DC
  Administered 2015-10-27 – 2015-10-29 (×3): 0.0625 mg via ORAL
  Filled 2015-10-26 (×3): qty 1

## 2015-10-26 MED ORDER — ACETAMINOPHEN 500 MG PO TABS
1000.0000 mg | ORAL_TABLET | Freq: Every day | ORAL | Status: DC
  Administered 2015-10-27 – 2015-10-29 (×3): 1000 mg via ORAL
  Filled 2015-10-26 (×3): qty 2

## 2015-10-26 MED ORDER — DILTIAZEM HCL ER COATED BEADS 240 MG PO CP24
240.0000 mg | ORAL_CAPSULE | Freq: Every day | ORAL | Status: DC
  Administered 2015-10-27 – 2015-10-28 (×2): 240 mg via ORAL
  Filled 2015-10-26 (×2): qty 1

## 2015-10-26 MED ORDER — FUROSEMIDE 10 MG/ML IJ SOLN
40.0000 mg | Freq: Every day | INTRAMUSCULAR | Status: DC
  Administered 2015-10-26 – 2015-10-28 (×3): 40 mg via INTRAVENOUS
  Filled 2015-10-26 (×4): qty 4

## 2015-10-26 MED ORDER — MEGESTROL ACETATE 400 MG/10ML PO SUSP
400.0000 mg | Freq: Every day | ORAL | Status: DC
  Administered 2015-10-27 – 2015-10-29 (×3): 400 mg via ORAL
  Filled 2015-10-26 (×3): qty 10

## 2015-10-26 MED ORDER — MIRTAZAPINE 15 MG PO TABS
45.0000 mg | ORAL_TABLET | Freq: Every day | ORAL | Status: DC
  Administered 2015-10-26 – 2015-10-28 (×3): 45 mg via ORAL
  Filled 2015-10-26: qty 3
  Filled 2015-10-26: qty 1
  Filled 2015-10-26 (×2): qty 3

## 2015-10-26 MED ORDER — LOSARTAN POTASSIUM 25 MG PO TABS
25.0000 mg | ORAL_TABLET | Freq: Every day | ORAL | Status: DC
  Administered 2015-10-27 – 2015-10-28 (×2): 25 mg via ORAL
  Filled 2015-10-26 (×2): qty 1

## 2015-10-26 MED ORDER — PANTOPRAZOLE SODIUM 40 MG PO TBEC
40.0000 mg | DELAYED_RELEASE_TABLET | Freq: Every day | ORAL | Status: DC
  Administered 2015-10-27 – 2015-10-29 (×3): 40 mg via ORAL
  Filled 2015-10-26 (×3): qty 1

## 2015-10-26 MED ORDER — ESCITALOPRAM OXALATE 10 MG PO TABS
10.0000 mg | ORAL_TABLET | Freq: Every day | ORAL | Status: DC
  Administered 2015-10-27 – 2015-10-29 (×3): 10 mg via ORAL
  Filled 2015-10-26 (×3): qty 1

## 2015-10-26 MED ORDER — PANTOPRAZOLE SODIUM 40 MG PO TBEC
40.0000 mg | DELAYED_RELEASE_TABLET | Freq: Every day | ORAL | Status: DC
  Filled 2015-10-26: qty 1

## 2015-10-26 MED ORDER — ESCITALOPRAM OXALATE 10 MG PO TABS
ORAL_TABLET | ORAL | Status: AC
Start: 2015-10-26 — End: 2015-10-27
  Filled 2015-10-26: qty 1

## 2015-10-26 MED ORDER — DILTIAZEM HCL ER COATED BEADS 240 MG PO CP24
240.0000 mg | ORAL_CAPSULE | Freq: Every day | ORAL | Status: DC
  Filled 2015-10-26: qty 1

## 2015-10-26 MED ORDER — ENOXAPARIN SODIUM 40 MG/0.4ML ~~LOC~~ SOLN
40.0000 mg | SUBCUTANEOUS | Status: DC
  Administered 2015-10-26 – 2015-10-27 (×2): 40 mg via SUBCUTANEOUS
  Filled 2015-10-26 (×3): qty 0.4

## 2015-10-26 NOTE — H&P (Signed)
[facility] - [place] at [facility]   PATIENT NAME: [name]    MR#:  [id number]  DATE OF BIRTH:  February 05, 1915  DATE OF ADMISSION:  10/26/2015  PRIMARY CARE PHYSICIAN: Sherlene Shams, MD   REQUESTING/REFERRING PHYSICIAN: Dr. Shaune Pollack  CHIEF COMPLAINT:   Chief Complaint  Patient presents with  . Shortness of Breath  . Altered Mental Status  . Chest Pain    HISTORY OF PRESENT ILLNESS:  [name]  is a 82 y.o. female with a known history of hypertension, dementia brought in from group home secondary to shortness of breath. Patient is at  [facility] family care home. patient has shortness of breath this morning. No chest pain. Recently was treated for UTI, CHF.  no pedal edema, no weight gain, no orthopnea or PND.   PAST MEDICAL HISTORY:   Past Medical History  Diagnosis Date  . Incontinence of urine     bladder tac, collagen injections (Dr. Evelene Croon)  . Diverticulosis     with bleeding  . Atrial fibrillation (HCC)   . Arthritis     secondary to OA and DJD  . Hypertension   . GERD (gastroesophageal reflux disease)   . Heart murmur   . Hyperlipidemia   . CHF (congestive heart failure) (HCC)     EF 35-35% by y last ECHO  . Diverticulosis of colon     PAST SURGICAL HISTOIRY:   Past Surgical History  Procedure Laterality Date  . Cholecystectomy    . Laminectomy    . Shoulder hemi-arthroplasty  2010    right shoulder intramedullar rod and screw  . Spinal cord decompression      laminectomy  . Joint replacement      bilateral knee    SOCIAL HISTORY:   Social History  Substance Use Topics  . Smoking status: Never Smoker   . Smokeless tobacco: Never Used  . Alcohol Use: No    FAMILY HISTORY:   Family History  Problem Relation Age of Onset  . Cancer Son     renal cell carcinoma    DRUG ALLERGIES:   Allergies  Allergen Reactions  . Aspirin Other (See Comments)    Unknown reaction  . Sulfa Antibiotics Hives and Rash     REVIEW OF SYSTEMS:  CONSTITUTIONAL: No fever, fatigue or weakness.  EYES: No blurred or double vision.  EARS, NOSE, AND THROAT: No tinnitus or ear pain.  RESPIRAT;dry cough, shortness of breath. CARDIOVASCULAR: No chest pain, orthopnea, edema.  GASTROINTESTINAL: No nausea, vomiting, diarrhea or abdominal pain.  GENITOURINARY: No dysuria, hematuria.  ENDOCRINE: No polyuria, nocturia,  HEMATOLOGY: No anemia, easy bruising or bleeding SKIN: No rash or lesion. MUSCULOSKELETAL: No joint pain or arthritis.   NEUROLOGIC: No tingling, numbness, weakness.  PSYCHIATRY: No anxiety or depression.   MEDICATIONS AT HOME:   Prior to Admission medications   Medication Sig Start Date End Date Taking? Authorizing Provider  acetaminophen (TYLENOL) 500 MG tablet Take 1,000 mg by mouth daily after breakfast.    Historical Provider, MD  Calcium Carbonate-Vitamin D 600-400 MG-UNIT per tablet TAKE 1 TABLET BY MOUTH 2 TIMES A DAY WITH MEALS. 06/07/15   Sherlene Shams, MD  digoxin (LANOXIN) 0.125 MG tablet TAKE 1/2 TABLET BY MOUTH EACH MORNING FOR HEART Patient taking differently: TAKE 1/2 TABLET BY MOUTH ONCE DAILY 09/16/15   Sherlene Shams, MD  diltiazem (CARDIZEM CD) 240 MG 24 hr capsule TAKE ONE CAPSULE BY MOUTH ONCE DAILY FORHEART AND BLOOD PRESSURE (  HIGH) 09/09/15   Sherlene Shams, MD  escitalopram (LEXAPRO) 10 MG tablet TAKE 1 TABLET BY MOUTH ONCE DAILY FOR DEPRESSION. 09/24/15   Sherlene Shams, MD  furosemide (LASIX) 20 MG tablet TAKE 1 TABLET BY MOUTH ONCE DAILY FOR EDEMA 08/20/15   Sherlene Shams, MD  losartan (COZAAR) 25 MG tablet Take 1 tablet (25 mg total) by mouth daily. 10/08/15   Enid Baas, MD  megestrol (MEGACE) 400 MG/10ML suspension Take 10 mLs (400 mg total) by mouth daily. 10/08/15   Enid Baas, MD  meloxicam (MOBIC) 7.5 MG tablet Take 1 tablet (7.5 mg total) by mouth daily. 08/13/15   Sherlene Shams, MD  mirtazapine (REMERON) 45 MG tablet Take 1 tablet (45 mg total) by mouth  at bedtime. 08/13/15   Sherlene Shams, MD  omeprazole (PRILOSEC) 40 MG capsule TAKE ONE CAPSULE BY MOUTH ONCE DAILY FOR GERD 09/09/15   Sherlene Shams, MD      VITAL SIGNS:  Blood pressure 141/79, pulse 94, temperature 98.1 F (36.7 C), temperature source Oral, resp. rate 27, weight 52.164 kg (115 lb), SpO2 97 %.  PHYSICAL EXAMINATION:  GENERAL:  82 y.o.-year-old patient lying in the bed with no acute distress.  EYES: Pupils equal, round, reactive to light and accommodation. No scleral icterus. Extraocular muscles intact.  HEENT: Head atraumatic, normocephalic. Oropharynx and nasopharynx clear.  NECK:  Supple, no jugular venous distention. No thyroid enlargement, no tenderness.  LUNGS: Normal breath sounds bilaterally, no wheezing, faint basilar creps . No use of accessory muscles of respiration.  CARDIOVASCULAR:  irregularly irregular, No murmurs, rubs, or gallops.  ABDOMEN: Soft, nontender, nondistended. Bowel sounds present. No organomegaly or mass.  EXTREMITIES: No pedal edema, cyanosis, or clubbing.  NEUROLOGIC: Cranial nerves II through XII are intact. Muscle strength 5/5 in all extremities. Sensation intact. Gait not checked.  PSYCHIATRIC: The patient is alert and oriented x 3.  SKIN: No obvious rash, lesion, or ulcer.   LABORATORY PANEL:   CBC  Recent Labs Lab 10/26/15 1047  WBC 7.9  HGB 12.7  HCT 38.8  PLT 192   ------------------------------------------------------------------------------------------------------------------  Chemistries   Recent Labs Lab 10/26/15 1047  NA 143  K 3.8  CL 106  CO2 25  GLUCOSE 111*  BUN 22*  CREATININE 0.80  CALCIUM 9.2   ------------------------------------------------------------------------------------------------------------------  Cardiac Enzymes  Recent Labs Lab 10/26/15 1047  TROPONINI 0.06*    ------------------------------------------------------------------------------------------------------------------  RADIOLOGY:  Ct Head Wo Contrast  10/26/2015  CLINICAL DATA:  Altered mental status. Not acting like himself. 82 year old female EXAM: CT HEAD WITHOUT CONTRAST TECHNIQUE: Contiguous axial images were obtained from the base of the skull through the vertex without intravenous contrast. COMPARISON:  Head CT 02/11/2014 FINDINGS: No acute intracranial hemorrhage. No focal mass lesion. No CT evidence of acute infarction. No midline shift or mass effect. No hydrocephalus. Basilar cisterns are patent. There is generalized cortical atrophy and periventricular white matter hypodensities not changed from prior. Paranasal sinuses and  mastoid air cells are clear. IMPRESSION: 1. No acute intracranial findings.  No change from prior. 2. Chronic atrophy and white matter microvascular disease. Electronically Signed   By: Genevive Bi M.D.   On: 10/26/2015 12:12   Dg Chest Port 1 View  10/26/2015  CLINICAL DATA:  New onset shortness of breath beginning this morning. EXAM: PORTABLE CHEST 1 VIEW COMPARISON:  10/04/2015 FINDINGS: Lungs are hypoinflated with mild interval development of bibasilar opacification and mild prominence of the perihilar markings. Findings may be due  to mild vascular congestion with small effusions, although cannot exclude infection in the lung bases. Stable cardiomegaly calcified plaque over the thoracic aorta. Remainder of the exam is unchanged. IMPRESSION: Hypoinflation with interval development of mild bibasilar opacification and prominence of the perihilar markings. Findings may be due to mild vascular congestion with small pleural effusions/atelectasis, although cannot exclude infection in the lung bases. Mild stable cardiomegaly. Electronically Signed   By: Elberta Fortisaniel  Boyle M.D.   On: 10/26/2015 11:32    EKG:   Orders placed or performed during the hospital encounter of  10/26/15  . ED EKG  . ED EKG  Atrial fibrillation 89 bpm  IMPRESSION AND PLAN:   1.Acute  chronic diastolic heart failure: Recent  echo showed EF more than 55%. BNP is elevated at 714. We are  going to admit her for CHF exacerbation. Started on very small dose Lasix considering her advanced age.   #2 .dementia and depression: Continue home medication. #3 chronic atrial fibrillation: Rate controlled, continue her digoxin, Cardizem CD 240 mg daily. #4. hypertension: Continue losartan. GERD continue PPIs. Line CODE STATUS DO NOT RESUSCITATE,  family wants to talk to social worker regarding  [facility],   All the records are reviewed and case discussed with ED provider. Management plans discussed with the patient, family and they are in agreement.  CODE STATUS: DNR  TOTAL TIME TAKING CARE OF THIS PATIENT: 55 minutes.    Katha HammingKONIDENA,Savilla Turbyfill M.D on 10/26/2015 at 2:05 PM  Between 7am to 6pm - Pager - [phone]  After 6pm go to [email or web address] - password [email or web address]  [facility]  Office  [phone]  CC: Primary care physician; Sherlene ShamsULLO, TERESA L, MD  Note: This dictation was prepared with Dragon dictation along with smaller phrase technology. Any transcriptional errors that result from this process are unintentional.

## 2015-10-26 NOTE — ED Provider Notes (Signed)
[facility] Emergency Department Provider Note   ____________________________________________  Time seen: Approximately 11 AM I have reviewed the triage vital signs and the triage nursing note.  HISTORY  Chief Complaint Shortness of Breath; Altered Mental Status; and Chest Pain   Historian Limited: Patient poor historian History from daughter   HPI JHOANA UPHAM is a 82 y.o. female who states a family care home, but the daughter says that she is there almost every day. This morning she has an acute altered mental status and shortness of breath. Daughter states the patient had congestive heart failure recently, and this a UTI and symptoms were similar to this. Patient typically speaks a lot and is speaking very little right now. No known fever. Seems that she is reporting some central chest discomfort which is more so shortness of breath.    Past Medical History  Diagnosis Date  . Incontinence of urine     bladder tac, collagen injections (Dr. Evelene Croon)  . Diverticulosis     with bleeding  . Atrial fibrillation (HCC)   . Arthritis     secondary to OA and DJD  . Hypertension   . GERD (gastroesophageal reflux disease)   . Heart murmur   . Hyperlipidemia   . CHF (congestive heart failure) (HCC)     EF 35-35% by y last ECHO  . Diverticulosis of colon     Patient Active Problem List   Diagnosis Date Noted  . Acute cystitis 10/08/2015  . OA (osteoarthritis) of knee 09/11/2014  . Bilateral hearing loss due to cerumen impaction 06/23/2014  . Major depression, melancholic type (HCC) 01/01/2014  . Failure to thrive in adult 12/03/2013  . Chronic right hip pain 05/05/2013  . Adnexal mass 05/05/2013  . Vitamin D deficiency 02/11/2013  . Constipation 04/09/2012  . Chronic atrial fibrillation (HCC) 07/26/2011  . Congestive heart failure with left ventricular systolic dysfunction (HCC) 07/26/2011  . Hypertension   . GERD (gastroesophageal reflux disease)    . Heart murmur   . Hyperlipidemia   . Diverticulosis of colon     Past Surgical History  Procedure Laterality Date  . Cholecystectomy    . Laminectomy    . Shoulder hemi-arthroplasty  2010    right shoulder intramedullar rod and screw  . Spinal cord decompression      laminectomy  . Joint replacement      bilateral knee    Current Outpatient Rx  Name  Route  Sig  Dispense  Refill  . acetaminophen (TYLENOL) 500 MG tablet   Oral   Take 1,000 mg by mouth daily after breakfast.         . Calcium Carbonate-Vitamin D 600-400 MG-UNIT per tablet      TAKE 1 TABLET BY MOUTH 2 TIMES A DAY WITH MEALS.   60 tablet   6   . digoxin (LANOXIN) 0.125 MG tablet      TAKE 1/2 TABLET BY MOUTH EACH MORNING FOR HEART Patient taking differently: TAKE 1/2 TABLET BY MOUTH ONCE DAILY   15 tablet   5   . diltiazem (CARDIZEM CD) 240 MG 24 hr capsule      TAKE ONE CAPSULE BY MOUTH ONCE DAILY FORHEART AND BLOOD PRESSURE (HIGH)   30 capsule   2   . escitalopram (LEXAPRO) 10 MG tablet      TAKE 1 TABLET BY MOUTH ONCE DAILY FOR DEPRESSION.   30 tablet   11   . furosemide (LASIX) 20 MG tablet  TAKE 1 TABLET BY MOUTH ONCE DAILY FOR EDEMA   90 tablet   3   . losartan (COZAAR) 25 MG tablet   Oral   Take 1 tablet (25 mg total) by mouth daily.   30 tablet   2   . megestrol (MEGACE) 400 MG/10ML suspension   Oral   Take 10 mLs (400 mg total) by mouth daily.   240 mL   0   . meloxicam (MOBIC) 7.5 MG tablet   Oral   Take 1 tablet (7.5 mg total) by mouth daily.   90 tablet   1   . mirtazapine (REMERON) 45 MG tablet   Oral   Take 1 tablet (45 mg total) by mouth at bedtime.   90 tablet   1   . omeprazole (PRILOSEC) 40 MG capsule      TAKE ONE CAPSULE BY MOUTH ONCE DAILY FOR GERD   30 capsule   2   . Vitamin D, Ergocalciferol, (DRISDOL) 50000 UNITS CAPS capsule      TAKE 1 CAPSULE BY MOUTH ONCE WEEKLY FOR SUPPLEMENT Patient taking differently: Take 50,000 Units by  mouth every Sunday.    12 capsule   3     Allergies Sulfa antibiotics  Family History  Problem Relation Age of Onset  . Cancer Son     renal cell carcinoma    Social History Social History  Substance Use Topics  . Smoking status: Never Smoker   . Smokeless tobacco: Never Used  . Alcohol Use: No    Review of Systems  Constitutional: Negative for fever. Eyes: Negative for visual changes. ENT: Negative for sore throat. Cardiovascular: Question chest discomfort Respiratory: Positive for shortness of breath. Gastrointestinal: Negative for abdominal pain, vomiting and diarrhea. Genitourinary: Negative for dysuria. Musculoskeletal: Negative for back pain. Skin: Negative for rash. Neurological: Negative for headache. 10 point Review of Systems otherwise negative ____________________________________________   PHYSICAL EXAM:  VITAL SIGNS: ED Triage Vitals  Enc Vitals Group     BP 10/26/15 1040 150/73 mmHg     Pulse Rate 10/26/15 1040 102     Resp 10/26/15 1040 23     Temp 10/26/15 1040 98.1 F (36.7 C)     Temp Source 10/26/15 1040 Oral     SpO2 10/26/15 1040 91 %     Weight 10/26/15 1040 115 lb (52.164 kg)     Height --      Head Cir --      Peak Flow --      Pain Score 10/26/15 1104 0     Pain Loc --      Pain Edu? --      Excl. in GC? --      Constitutional: Alert and cooperative but not much verbal communication. Crease respiratory rate but no respiratory distress.  Eyes: Conjunctivae are normal. PERRL. Normal extraocular movements. ENT   Head: Normocephalic and atraumatic.   Nose: No congestion/rhinnorhea.   Mouth/Throat: Mucous membranes are mildly dry.   Neck: No stridor. Cardiovascular/Chest: Normal rate, regular rhythm.  No murmurs, rubs, or gallops. Respiratory: Mild tachypnea. No retractions.. Breath sounds are clear and equal bilaterally. No wheezes. Mild rhonchi. Gastrointestinal: Soft. No distention, no guarding, no rebound.  Nontender.    Genitourinary/rectal:Deferred Musculoskeletal: Nontender with normal range of motion in all extremities. No joint effusions.  No lower extremity tenderness.  No edema. Neurologic: He speaks only a few words. No facial droop. Moves 4 extremities without focal weakness or focal numbness appreciated. Skin:  Skin is warm, dry and intact. No rash noted.   ____________________________________________   EKG I, Governor Rooksebecca Sacha Radloff, MD, the attending physician have personally viewed and interpreted all ECGs.  89 bpm. Atrial fibrillation. Narrow QRS. Normal axis. Nonspecific ST and T-wave ____________________________________________  LABS (pertinent positives/negatives)  CBC within normal limits. White blood count 7.9 Basic metabolic panel without significant abnormality Troponin 0.06 Lactate 1.4 BNP 714 Urinalysis  rare bacteria and otherwise negative  ____________________________________________  RADIOLOGY All Xrays were viewed by me. Imaging interpreted by Radiologist.  Chest portable 1 view:IMPRESSION: Hypoinflation with interval development of mild bibasilar opacification and prominence of the perihilar markings. Findings may be due to mild vascular congestion with small pleural effusions/atelectasis, although cannot exclude infection in the lung bases.  Mild stable cardiomegaly.  CT head noncontrast:  IMPRESSION: 1. No acute intracranial findings. No change from prior. 2. Chronic atrophy and white matter microvascular disease. __________________________________________  PROCEDURES  Procedure(s) performed: None  Critical Care performed: None  ____________________________________________   ED COURSE / ASSESSMENT AND PLAN  CONSULTATIONS: Hospitalist for admission  Pertinent labs & imaging results that were available during my care of the patient were reviewed by me and considered in my medical decision making (see chart for details).  Labs were drawn by  nurse under septic protocol due to heart rate of 102 and increased respiratory rate. On my examination patient's O2 sat is 94% on room air, respiratory rate just above 20 without any distress, and heart rate 93. She's not a fever and I am not highly concerned about sepsis at this point in time.  I will obtain chest x-ray, labs, urine, and a head CT. This was discussed with the daughter at the bedside.  ----------------------------------------- 1:29 PM on 10/26/2015 -----------------------------------------  Head CT was negative for acute finding. Labs and urine are essentially unremarkable with no indication of infection with normal white blood cell count, and urinalysis clean. No acute renal failure or I abnormalities. Her troponin is minimally elevated at 0.06, but this is in alignment with what it has been in previously minimally elevated.  She has continued to have altered mental status to her baseline, and so I have discussed this patient case with the hospitalist for observation admission.    Patient / Family / Caregiver informed of clinical course, medical decision-making process, and agree with plan.    ___________________________________________   FINAL CLINICAL IMPRESSION(S) / ED DIAGNOSES   Final diagnoses:  Altered mental status, unspecified altered mental status type              Note: This dictation was prepared with Dragon dictation. Any transcriptional errors that result from this process are unintentional   Governor Rooksebecca Caera Enwright, MD 10/26/15 1330

## 2015-10-26 NOTE — Progress Notes (Signed)
[facility]. Only oriented to self. Family was at bedside upon admission, now have gone home. Patient was recently in the hospital a few weeks ago for a CHF exacerbation and similar symptoms. Currently on 2L of oxygen, acute. Has some fine crackles in the bases and dyspnea on exertion. Attempted to stand patient to pivot to bedside commode, unable to stand. Patient has used bedpan twice, but has also been incontinent of urine as well. Afib on tele. Vitals are stable. Per family patient has no difficulties swallowing, but does have decreased appetite. Will continue to monitor.

## 2015-10-26 NOTE — Progress Notes (Signed)
Nurse was informed by telly clerk that patient had 15 beats of Vtach. Nurse assess patient and VS taken. Dr. Desiree HaneVachani informed about the above information. New order check mag. Level. Will continue to monitor.

## 2015-10-26 NOTE — Progress Notes (Signed)
Nurse received report from Leah Designer, multimedia(dayshift nurse) was informed that the patient's daughter(POA) wanted her mother to be a full code. Doctor paged, awaiting for a call back.

## 2015-10-26 NOTE — ED Notes (Signed)
[facility] with [facility]/o AMS,  Dyspnea, chest pain

## 2015-10-26 NOTE — Progress Notes (Signed)
Spoke with Dr. Desiree HaneVachani in reference to code status. Patient is now a full code.

## 2015-10-26 NOTE — ED Notes (Signed)
Troponin 0.06. Dr. Shaune PollackLord notified.

## 2015-10-27 LAB — BASIC METABOLIC PANEL
ANION GAP: 11 (ref 5–15)
BUN: 17 mg/dL (ref 6–20)
CALCIUM: 8.9 mg/dL (ref 8.9–10.3)
CO2: 28 mmol/L (ref 22–32)
Chloride: 105 mmol/L (ref 101–111)
Creatinine, Ser: 0.67 mg/dL (ref 0.44–1.00)
GLUCOSE: 90 mg/dL (ref 65–99)
Potassium: 3.7 mmol/L (ref 3.5–5.1)
Sodium: 144 mmol/L (ref 135–145)

## 2015-10-27 LAB — CBC
HCT: 42.1 % (ref 35.0–47.0)
Hemoglobin: 13.6 g/dL (ref 12.0–16.0)
MCH: 30.4 pg (ref 26.0–34.0)
MCHC: 32.2 g/dL (ref 32.0–36.0)
MCV: 94.5 fL (ref 80.0–100.0)
PLATELETS: 209 10*3/uL (ref 150–440)
RBC: 4.46 MIL/uL (ref 3.80–5.20)
RDW: 15.4 % — AB (ref 11.5–14.5)
WBC: 8.3 10*3/uL (ref 3.6–11.0)

## 2015-10-27 LAB — DIGOXIN LEVEL: Digoxin Level: 1 ng/mL (ref 0.8–2.0)

## 2015-10-27 MED ORDER — ENOXAPARIN SODIUM 30 MG/0.3ML ~~LOC~~ SOLN
30.0000 mg | SUBCUTANEOUS | Status: DC
  Administered 2015-10-28: 30 mg via SUBCUTANEOUS
  Filled 2015-10-27: qty 0.3

## 2015-10-27 MED ORDER — DEXTROSE 5 % IV SOLN
500.0000 mg | INTRAVENOUS | Status: DC
  Administered 2015-10-28: 500 mg via INTRAVENOUS
  Filled 2015-10-27: qty 500

## 2015-10-27 MED ORDER — DEXTROSE 5 % IV SOLN
500.0000 mg | Freq: Once | INTRAVENOUS | Status: AC
  Administered 2015-10-27: 500 mg via INTRAVENOUS
  Filled 2015-10-27: qty 500

## 2015-10-27 MED ORDER — DEXTROSE 5 % IV SOLN
1.0000 g | INTRAVENOUS | Status: DC
  Administered 2015-10-27 – 2015-10-28 (×2): 1 g via INTRAVENOUS
  Filled 2015-10-27 (×3): qty 10

## 2015-10-27 NOTE — Progress Notes (Signed)
Anticoagulation Monitoring  Patient is a 82 yo female with orders for Lovenox 40 mg subq q24h.  Patient's est CrCl~28 mL/min.  Per anticoagulation policy, will transition patient to Lovenox 30 mg subq q24h based on CrCl<30 mL/min.  Pharmacy will continue to monitor per policy.  Clarisa Schoolsrystal Natash Berman, PharmD Clinical Pharmacist 10/27/2015

## 2015-10-27 NOTE — Progress Notes (Signed)
Patient ID: Edger House, female   DOB: Nov 12, 1914, 82 y.o.   MRN: [id number] [facility] PROGRESS NOTE  JADDA HUNSUCKER FAO:[id number] DOB: 06-14-1915 DOA: 10/26/2015 PCP: Sherlene Shams, MD  HPI/Subjective: Patient has been sleeping all day. I woke her up she did not have any complaints. As per the daughter, she was short of breath when she came in. The daughter was also interested in hospice back at her facility.   Objective: Filed Vitals:   10/27/15 0600 10/27/15 0926  BP: 104/68 129/52  Pulse: 91 95  Temp: 97.8 F (36.6 C)   Resp:      Filed Weights   10/26/15 1040 10/26/15 1631 10/27/15 0644  Weight: 52.164 kg (115 lb) 52.935 kg (116 lb 11.2 oz) 51.619 kg (113 lb 12.8 oz)    ROS: Review of Systems  Unable to perform ROS Respiratory: Negative for cough and shortness of breath.   Cardiovascular: Negative for chest pain.  Gastrointestinal: Negative for abdominal pain.  limited with her lethargy Exam: Physical Exam  Constitutional: She appears lethargic.  HENT:  Nose: No mucosal edema.  Mouth/Throat: No oropharyngeal exudate or posterior oropharyngeal edema.  Eyes: Conjunctivae are normal. Pupils are equal, round, and reactive to light.  Neck: Carotid bruit is not present. No thyroid mass and no thyromegaly present.  Cardiovascular: Regular rhythm, S1 normal and S2 normal.   Murmur heard.  Systolic murmur is present with a grade of 4/6  Respiratory: No respiratory distress. She has decreased breath sounds in the right middle field, the right lower field, the left middle field and the left lower field. She has no wheezes. She has rhonchi in the right lower field and the left lower field. She has no rales.  GI: Soft. Bowel sounds are normal. There is no tenderness.  Musculoskeletal:       Right ankle: She exhibits no swelling.       Left ankle: She exhibits no swelling.  Lymphadenopathy:    She has no cervical adenopathy.  Neurological: She appears  lethargic.  Skin: Skin is warm. No rash noted. Nails show no clubbing.  Psychiatric:  Lethargic      Data Reviewed: Basic Metabolic Panel:  Recent Labs Lab 10/26/15 1047 10/26/15 2047 10/27/15 0525  NA 143  --  144  K 3.8  --  3.7  CL 106  --  105  CO2 25  --  28  GLUCOSE 111*  --  90  BUN 22*  --  17  CREATININE 0.80  --  0.67  CALCIUM 9.2  --  8.9  MG  --  1.8  --    CBC:  Recent Labs Lab 10/26/15 1047 10/27/15 0525  WBC 7.9 8.3  NEUTROABS 6.4  --   HGB 12.7 13.6  HCT 38.8 42.1  MCV 93.4 94.5  PLT 192 209   Cardiac Enzymes:  Recent Labs Lab 10/26/15 1047  TROPONINI 0.06*   BNP (last 3 results)  Recent Labs  10/26/15 1047  BNP 714.0*     Recent Results (from the past 240 hour(s))  Culture, blood (routine x 2)     Status: None (Preliminary result)   Collection Time: 10/26/15 10:47 AM  Result Value Ref Range Status   Specimen Description BLOOD LEFT ARM  Final   Special Requests BOTTLES DRAWN AEROBIC AND ANAEROBIC  2CC  Final   Culture NO GROWTH < 12 HOURS  Final   Report Status PENDING  Incomplete  Culture, blood (routine x  2)     Status: None (Preliminary result)   Collection Time: 10/26/15 10:55 AM  Result Value Ref Range Status   Specimen Description BLOOD RIGHT ANTECUBITAL  Final   Special Requests   Final    BOTTLES DRAWN AEROBIC AND ANAEROBIC  AER 5CC ANA 1CC   Culture NO GROWTH < 12 HOURS  Final   Report Status PENDING  Incomplete  Urine culture     Status: None (Preliminary result)   Collection Time: 10/26/15 12:36 PM  Result Value Ref Range Status   Specimen Description URINE, RANDOM  Final   Special Requests NONE  Final   Culture NO GROWTH < 24 HOURS  Final   Report Status PENDING  Incomplete     Studies: Ct Head Wo Contrast  10/26/2015  CLINICAL DATA:  Altered mental status. Not acting like himself. 82 year old female EXAM: CT HEAD WITHOUT CONTRAST TECHNIQUE: Contiguous axial images were obtained from the base of the skull  through the vertex without intravenous contrast. COMPARISON:  Head CT 02/11/2014 FINDINGS: No acute intracranial hemorrhage. No focal mass lesion. No CT evidence of acute infarction. No midline shift or mass effect. No hydrocephalus. Basilar cisterns are patent. There is generalized cortical atrophy and periventricular white matter hypodensities not changed from prior. Paranasal sinuses and  mastoid air cells are clear. IMPRESSION: 1. No acute intracranial findings.  No change from prior. 2. Chronic atrophy and white matter microvascular disease. Electronically Signed   By: Genevive Bi M.D.   On: 10/26/2015 12:12   Dg Chest Port 1 View  10/26/2015  CLINICAL DATA:  New onset shortness of breath beginning this morning. EXAM: PORTABLE CHEST 1 VIEW COMPARISON:  10/04/2015 FINDINGS: Lungs are hypoinflated with mild interval development of bibasilar opacification and mild prominence of the perihilar markings. Findings may be due to mild vascular congestion with small effusions, although cannot exclude infection in the lung bases. Stable cardiomegaly calcified plaque over the thoracic aorta. Remainder of the exam is unchanged. IMPRESSION: Hypoinflation with interval development of mild bibasilar opacification and prominence of the perihilar markings. Findings may be due to mild vascular congestion with small pleural effusions/atelectasis, although cannot exclude infection in the lung bases. Mild stable cardiomegaly. Electronically Signed   By: Elberta Fortis M.D.   On: 10/26/2015 11:32    Scheduled Meds: . acetaminophen  1,000 mg Oral QPC breakfast  . azithromycin  500 mg Intravenous Once  . [START ON 10/28/2015] azithromycin  500 mg Intravenous Q24H  . cefTRIAXone (ROCEPHIN)  IV  1 g Intravenous Q24H  . digoxin  0.0625 mg Oral Daily  . diltiazem  240 mg Oral Daily  . enoxaparin (LOVENOX) injection  40 mg Subcutaneous Q24H  . escitalopram  10 mg Oral Daily  . furosemide  40 mg Intravenous Daily  .  losartan  25 mg Oral Daily  . megestrol  400 mg Oral Daily  . meloxicam  7.5 mg Oral Daily  . mirtazapine  45 mg Oral QHS  . pantoprazole  40 mg Oral Daily    Assessment/Plan:  1. Lethargy - chest x-ray could not rule out early infection I'll start Rocephin and Zithromax.  2.  acute on chronic diastolic congestive heart failure with increased pulmonary artery systolic pressures. IV Lasix daily 3. Atrial fibrillation history- digoxin diltiazem for heart rate control. Check digoxin level 4. Essential hypertension blood pressure on the lower side 5. Gastroesophageal reflux disease on Protonix 6. Family interested in hospice at facility. I made the patient to DO NOT  RESUSCITATE  Code Status:     Code Status Orders        Start     Ordered   10/27/15 1224  Do not attempt resuscitation (DNR)   Continuous    Question Answer Comment  In the event of cardiac or respiratory ARREST Do not call a "code blue"   In the event of cardiac or respiratory ARREST Do not perform Intubation, CPR, defibrillation or ACLS   In the event of cardiac or respiratory ARREST Use medication by any route, position, wound care, and other measures to relive pain and suffering. May use oxygen, suction and manual treatment of airway obstruction as needed for comfort.   Comments nurse may pronounce      10/27/15 1223    Code Status History    Date Active Date Inactive Code Status Order ID Comments User Context   10/26/2015  7:53 PM 10/27/2015 12:23 PM Full Code [id number]  Emelda Brothersiffany D Mattocks, RN Inpatient   10/26/2015  2:05 PM 10/26/2015  7:53 PM DNR [id number]  Katha HammingSnehalatha Konidena, MD ED   10/08/2015  9:08 AM 10/08/2015  4:02 PM Full Code [id number]  Enid Baasadhika Kalisetti, MD Inpatient   10/04/2015 12:26 PM 10/08/2015  9:08 AM DNR [id number]  Ramonita LabAruna Gouru, MD Inpatient     Family Communication: Daughter at the bedside Disposition Plan: potentially back to her facility with hospice soon   Antibiotics:  Rocephin    Zithromax  Time spent: 25 minutes   Alford HighlandWIETING, [place]ope Marte  [facility]

## 2015-10-27 NOTE — Clinical Social Work Note (Signed)
Clinical Social Work Assessment  Patient Details  Name: [name] MRN: [id number] Date of Birth: Mar 28, 1915  Date of referral:  10/27/15               Reason for consult:  Facility Placement, End of Life/Hospice                Permission sought to share information with:  Facility Sport and exercise psychologist, Family Supports Permission granted to share information::  Yes, Verbal Permission Granted  Name::        Agency::     Relationship::     Contact Information:     Housing/Transportation Living arrangements for the past 2 months:  Group Home Source of Information:  Adult Children Patient Interpreter Needed:  None Criminal Activity/Legal Involvement Pertinent to Current Situation/Hospitalization:  No - Comment as needed Significant Relationships:  Adult Children Lives with:    Do you feel safe going back to the place where you live?  Yes Need for family participation in patient care:  Yes (Comment)  Care giving concerns:  None; patient resides at [facility].   Social Worker assessment / plan:  CSW received call from MD stating that patient's family was interested in getting hospice arranged for when patient returns to the family care home. Patient is known to this CSW when patient was in the hospital last admission. CSW met with patient's daughter: [name]: [phone] in patient's room this afternoon and she stated that the owner of [facility]: [name]: [phone] was wanting to have hospice follow patient and would be able to take patient back at discharge. CSW discussed hospice with Ms. Amedeo Plenty and she has no preference as to which agency is arranged. She is in agreement with [facility] and CSW informed her that if they are not closed for the holiday tomorrow, that we would get the hospice liason to come and speak with her further. CSW explained that equipment would be set up by hospice prior to discharge from hospital. [place] contacted  owner of [facility], Mrs. [name], and she stated that she can take patient back with hospice and that if patient needs oxygen that will not be a problem. [name] asked about a hospital bed and stated that this would not be an issue either. Message sent to [facility] with [place] for a heads up on referral for tomorrow. If due to the holiday hospice is closed, CSW will make referral to their intake tomorrow.   Employment status:  Retired Nurse, adult PT Recommendations:  Not assessed at this time Information / Referral to community resources:     Patient/Family's Response to care:  Patient's daughter expressed appreciation for CSW assistance.  Patient/Family's Understanding of and Emotional Response to Diagnosis, Current Treatment, and Prognosis:  Patient's daughter is ready for hospice to be involved with patient at her group home.  Emotional Assessment Appearance:  Appears stated age Attitude/Demeanor/Rapport:   (lethargic) Affect (typically observed):    Orientation:  Oriented to Self Alcohol / Substance use:  Not Applicable Psych involvement (Current and /or in the community):  No (Comment)  Discharge Needs  Concerns to be addressed:  Care Coordination Readmission within the last 30 days:  Yes Current discharge risk:  None Barriers to Discharge:  No Barriers Identified   Jennifer Leff, LCSW 10/27/2015, 1:47 PM

## 2015-10-27 NOTE — Progress Notes (Signed)
Patient has rested comfortably today. Afib on tele. Vitals are stable. No complaints of pain unless moving patient to change her. Has been incontinent. IV abx started today. Daughter has been at bedside intermittently. Discussed with MD about [facility] or [facility]. Patient has been sleeping off and on most of the day. Was able to feed herself some, but only took a few bites of each meal.

## 2015-10-27 NOTE — Care Management Note (Signed)
Case Management Note  Patient Details  Name: [name] MRN: [id number] Date of Birth: 1914/10/30  Subjective/Objective:     Family is inquiring about [facility] services. This Clinical research associatewriter called [facility] about criteria for [employer] [facility]. [facility] suggested that the MD make a Hospice Consult and Dayna BarkerKaren Robertson would do an evaluation and refer [name] to the appropriate provider, either [facility], Hospice, or other.  82yo [name] has CHF and dementia, and [facility].              Action/Plan:   Expected Discharge Date:                  Expected Discharge Plan:     In-House Referral:     Discharge planning Services     Post Acute Care Choice:    Choice offered to:     DME Arranged:    DME Agency:     HH Arranged:    HH Agency:     Status of Service:     Medicare Important Message Given:    Date Medicare IM Given:    Medicare IM give by:    Date Additional Medicare IM Given:    Additional Medicare Important Message give by:     If discussed at Long Length of Stay Meetings, dates discussed:    Additional Comments:  Thurlow Gallaga A, RN 10/27/2015, 9:35 AM

## 2015-10-28 ENCOUNTER — Inpatient Hospital Stay: Payer: Commercial Managed Care - HMO

## 2015-10-28 LAB — URINE CULTURE: Culture: NO GROWTH

## 2015-10-28 MED ORDER — DILTIAZEM HCL ER COATED BEADS 180 MG PO CP24
180.0000 mg | ORAL_CAPSULE | Freq: Every day | ORAL | Status: DC
  Administered 2015-10-29: 180 mg via ORAL
  Filled 2015-10-28: qty 1

## 2015-10-28 MED ORDER — AZITHROMYCIN 250 MG PO TABS
250.0000 mg | ORAL_TABLET | Freq: Every day | ORAL | Status: DC
  Administered 2015-10-29: 250 mg via ORAL
  Filled 2015-10-28: qty 1

## 2015-10-28 MED ORDER — FUROSEMIDE 20 MG PO TABS
20.0000 mg | ORAL_TABLET | Freq: Every day | ORAL | Status: DC
  Administered 2015-10-29: 20 mg via ORAL
  Filled 2015-10-28: qty 1

## 2015-10-28 MED ORDER — AZITHROMYCIN 250 MG PO TABS: 250.0000 mg | ORAL_TABLET | Freq: Every day | ORAL | Status: DC

## 2015-10-28 NOTE — Progress Notes (Signed)
Patient ID: [name], female   DOB: [birth year], 82 y.o.   MRN: [id number] [facility] PROGRESS NOTE  ANTWANETTE WESCHE WUJ:[id number] DOB: Dec 30, 1914 DOA: 10/26/2015 PCP: Sherlene Shams, MD  HPI/Subjective: Patient still sleeping a lot as per nursing staff. Patient complains of shortness of breath and cough today. She states that she's not feeling well.  Objective: Filed Vitals:   10/28/15 1223 10/28/15 1226  BP: 92/42 92/43  Pulse: 58 64  Temp:    Resp:      Filed Weights   10/26/15 1040 10/26/15 1631 10/27/15 0644  Weight: 52.164 kg (115 lb) 52.935 kg (116 lb 11.2 oz) 51.619 kg (113 lb 12.8 oz)    ROS: Review of Systems  Unable to perform ROS Respiratory: Positive for cough and shortness of breath.   Cardiovascular: Negative for chest pain.  Gastrointestinal: Negative for abdominal pain.  limited with her lethargy Exam: Physical Exam  Constitutional: She appears lethargic.  HENT:  Nose: No mucosal edema.  Mouth/Throat: No oropharyngeal exudate or posterior oropharyngeal edema.  Eyes: Conjunctivae are normal. Pupils are equal, round, and reactive to light.  Neck: Carotid bruit is not present. No thyroid mass and no thyromegaly present.  Cardiovascular: Regular rhythm, S1 normal and S2 normal.   Murmur heard.  Systolic murmur is present with a grade of 4/6  Respiratory: No respiratory distress. She has decreased breath sounds in the right middle field, the right lower field, the left middle field and the left lower field. She has no wheezes. She has rhonchi in the right lower field and the left lower field. She has no rales.  GI: Soft. Bowel sounds are normal. There is no tenderness.  Musculoskeletal:       Right ankle: She exhibits no swelling.       Left ankle: She exhibits no swelling.  Lymphadenopathy:    She has no cervical adenopathy.  Neurological: She appears lethargic.  Skin: Skin is warm. No rash noted. Nails show no clubbing.   Psychiatric:  Lethargic      Data Reviewed: Basic Metabolic Panel:  Recent Labs Lab 10/26/15 1047 10/26/15 2047 10/27/15 0525  NA 143  --  144  K 3.8  --  3.7  CL 106  --  105  CO2 25  --  28  GLUCOSE 111*  --  90  BUN 22*  --  17  CREATININE 0.80  --  0.67  CALCIUM 9.2  --  8.9  MG  --  1.8  --    CBC:  Recent Labs Lab 10/26/15 1047 10/27/15 0525  WBC 7.9 8.3  NEUTROABS 6.4  --   HGB 12.7 13.6  HCT 38.8 42.1  MCV 93.4 94.5  PLT 192 209   Cardiac Enzymes:  Recent Labs Lab 10/26/15 1047  TROPONINI 0.06*   BNP (last 3 results)  Recent Labs  10/26/15 1047  BNP 714.0*     Recent Results (from the past 240 hour(s))  Culture, blood (routine x 2)     Status: None (Preliminary result)   Collection Time: 10/26/15 10:47 AM  Result Value Ref Range Status   Specimen Description BLOOD LEFT ARM  Final   Special Requests BOTTLES DRAWN AEROBIC AND ANAEROBIC  2CC  Final   Culture NO GROWTH 1 DAY  Final   Report Status PENDING  Incomplete  Culture, blood (routine x 2)     Status: None (Preliminary result)   Collection Time: 10/26/15 10:55 AM  Result Value Ref  Range Status   Specimen Description BLOOD RIGHT ANTECUBITAL  Final   Special Requests   Final    BOTTLES DRAWN AEROBIC AND ANAEROBIC  AER 5CC ANA 1CC   Culture NO GROWTH 1 DAY  Final   Report Status PENDING  Incomplete  Urine culture     Status: None   Collection Time: 10/26/15 12:36 PM  Result Value Ref Range Status   Specimen Description URINE, RANDOM  Final   Special Requests NONE  Final   Culture NO GROWTH 2 DAYS  Final   Report Status 10/28/2015 FINAL  Final     Studies: Dg Chest 1 View  10/28/2015  CLINICAL DATA:  Shortness of breath, hypertension, dementia EXAM: CHEST 1 VIEW COMPARISON:  10/26/2015 FINDINGS: Cardiomegaly again noted. Mild suprahilar interstitial prominence probable residual mild interstitial edema. Small bilateral pleural effusion with bilateral basilar atelectasis or  infiltrate. IMPRESSION: Cardiomegaly. Mild suprahilar interstitial prominence probable residual mild interstitial edema. Small bilateral pleural effusion with bilateral basilar atelectasis or infiltrate. Electronically Signed   By: Natasha Mead M.D.   On: 10/28/2015 11:21    Scheduled Meds: . acetaminophen  1,000 mg Oral QPC breakfast  . [START ON 10/29/2015] azithromycin  250 mg Oral Daily  . cefTRIAXone (ROCEPHIN)  IV  1 g Intravenous Q24H  . digoxin  0.0625 mg Oral Daily  . diltiazem  240 mg Oral Daily  . enoxaparin (LOVENOX) injection  30 mg Subcutaneous Q24H  . escitalopram  10 mg Oral Daily  . furosemide  40 mg Intravenous Daily  . megestrol  400 mg Oral Daily  . meloxicam  7.5 mg Oral Daily  . mirtazapine  45 mg Oral QHS  . pantoprazole  40 mg Oral Daily    Assessment/Plan:  1. Lethargy - repeat chest x-ray could not rule out early infection. I will continue Rocephin and Zithromax.  2. Acute on chronic diastolic congestive heart failure with increased pulmonary artery systolic pressures. Change Lasix to oral for tomorrow. 3. Relative hypotension- DC'd losartan, decreased dose of Cardizem for tomorrow. 4. Atrial fibrillation history- digoxin diltiazem for heart rate control. The junction level normal 5. Gastroesophageal reflux disease on Protonix 6. Family interested in hospice at facility. I made the patient to DO NOT RESUSCITATE. Oxygen with hospice order written.  Code Status:     Code Status Orders        Start     Ordered   10/27/15 1224  Do not attempt resuscitation (DNR)   Continuous    Question Answer Comment  In the event of cardiac or respiratory ARREST Do not call a "code blue"   In the event of cardiac or respiratory ARREST Do not perform Intubation, CPR, defibrillation or ACLS   In the event of cardiac or respiratory ARREST Use medication by any route, position, wound care, and other measures to relive pain and suffering. May use oxygen, suction and manual  treatment of airway obstruction as needed for comfort.   Comments nurse may pronounce      10/27/15 1223    Code Status History    Date Active Date Inactive Code Status Order ID Comments User Context   10/26/2015  7:53 PM 10/27/2015 12:23 PM Full Code [id number]  Emelda Brothers, RN Inpatient   10/26/2015  2:05 PM 10/26/2015  7:53 PM DNR [id number]  Katha Hamming, MD ED   10/08/2015  9:08 AM 10/08/2015  4:02 PM Full Code [id number]  Enid Baas, MD Inpatient   10/04/2015 12:26 PM 10/08/2015  9:08 AM DNR [id number]  Ramonita LabAruna Gouru, MD Inpatient     Family Communication: Spoke with grandson earlier at the bedside. Then I spoke again with the daughter at the bedside Disposition Plan: potentially back to her facility with hospice tomorrow  Antibiotics:  Rocephin  Zithromax  Time spent: 25 minutes   Alford HighlandWIETING, Esley Brooking  [facility]

## 2015-10-28 NOTE — Progress Notes (Signed)
Initial Nutrition Assessment   INTERVENTION:    Meals and Snacks: Cater to patient preferences; will send soft snacks. Pt may benefit from Dysphagia III diet order as pt daughter reports pt eats mostly soft food items, ie applesauce, ice cream, pudding, canned fruit Medical Food Supplement Therapy: will send Magic cup BID as pt does not tolerate Ensure/Boost   NUTRITION DIAGNOSIS:   Inadequate oral intake related to acute illness as evidenced by per patient/family report, meal completion < 50%.  GOAL:   Patient will meet greater than or equal to 90% of their needs  MONITOR:    (Energy Intake, Electrolyte and Renal Profile, Digestive System)  REASON FOR ASSESSMENT:   Malnutrition Screening Tool    ASSESSMENT:   Pt admitted with SOB secondary to CHF. Pt with h/o dementia from Group home.  Past Medical History  Diagnosis Date  . Incontinence of urine     bladder tac, collagen injections (Dr. Evelene CroonWolff)  . Diverticulosis     with bleeding  . Atrial fibrillation (HCC)   . Arthritis     secondary to OA and DJD  . Hypertension   . GERD (gastroesophageal reflux disease)   . Heart murmur   . Hyperlipidemia   . CHF (congestive heart failure) (HCC)     EF 35-35% by y last ECHO  . Diverticulosis of colon      Diet Order:  Diet 2 gram sodium Room service appropriate?: Yes; Fluid consistency:: Thin    Current Nutrition: Per RN Leah, who has been caring for pt since admission, pt eats some in the am however eats less and less as the day progresses.   Food/Nutrition-Related History: Pt's daughter reports poor po intake since last admission, however eats what she likes when she does eat, ie fried chicken. Pt does not tolerate Ensure/Boost per report.   Scheduled Medications:  . acetaminophen  1,000 mg Oral QPC breakfast  . [START ON 10/29/2015] azithromycin  250 mg Oral Daily  . cefTRIAXone (ROCEPHIN)  IV  1 g Intravenous Q24H  . digoxin  0.0625 mg Oral Daily  . [START ON  10/29/2015] diltiazem  180 mg Oral Daily  . enoxaparin (LOVENOX) injection  30 mg Subcutaneous Q24H  . escitalopram  10 mg Oral Daily  . furosemide  20 mg Oral Daily  . megestrol  400 mg Oral Daily  . meloxicam  7.5 mg Oral Daily  . mirtazapine  45 mg Oral QHS  . pantoprazole  40 mg Oral Daily     Electrolyte/Renal Profile and Glucose Profile:   Recent Labs Lab 10/26/15 1047 10/26/15 2047 10/27/15 0525  NA 143  --  144  K 3.8  --  3.7  CL 106  --  105  CO2 25  --  28  BUN 22*  --  17  CREATININE 0.80  --  0.67  CALCIUM 9.2  --  8.9  MG  --  1.8  --   GLUCOSE 111*  --  90   Protein Profile: No results for input(s): ALBUMIN in the last 168 hours.  Gastrointestinal Profile: Last BM: 10/27/2015   Nutrition-Focused Physical Exam Findings:  Unable to complete Nutrition-Focused physical exam at this time.    Weight Change: Pt relatively weight stable per CHL encounters   Height:   Ht Readings from Last 1 Encounters:  10/26/15 5' (1.524 m)    Weight:   Wt Readings from Last 1 Encounters:  10/27/15 113 lb 12.8 oz (51.619 kg)   Wt Readings  from Last 10 Encounters:  10/27/15 113 lb 12.8 oz (51.619 kg)  10/08/15 115 lb 6.4 oz (52.345 kg)  01/11/15 113 lb 4 oz (51.37 kg)  10/30/14 108 lb (48.988 kg)  11/30/13 110 lb 8 oz (50.122 kg)     BMI:  Body mass index is 22.22 kg/(m^2).  Estimated Nutritional Needs:   Kcal:  BEE: 978kcals, TEE: (IF 1.1-1.3)(AF 1.3) 1170-1382kcals  Protein:  52-62g protein (1.0-1.2g/kg)  Fluid:  1290-1558mL of fluid (25-19mL/kg)  EDUCATION NEEDS:   No education needs identified at this time   MODERATE Care Level  Leda Quail, RD, LDN Pager [phone] Weekend/On-Call Pager [phone]

## 2015-10-28 NOTE — Progress Notes (Signed)
New referral for [facility] at [facility] following discharge. Ms. Haselton is 82 year old woman admitted to [facility] on 1/14 for treatment of CHF and a urinary tract infection. She has been treated with IV antibiotics and IV diuretics, she remains weak with a poor appetite. Family has spoken with attending physician Dr. Leslye Peer and would like for Ms. Brophy to return to her [facility] with hospice services. Writer met with patient's daughter Orland Dec as well as [facility] owner Leda Gauze to initiate education regarding hospice services, philosophy and team approach to care with good understanding voiced. Leda Gauze is familiar with hospice services as she has had other residents that received care there.Hospice information and contact numbers left with [facility]. Patient will require oxygen and a hospital bed to be delivered prior to discharge.  Information faxed to referral intake, DME ordered. Leda Gauze to be contact for delivery. Signed portable DNR in place on patient's chart. Hospital care team all aware of discharge plan. Patient will require EMS transport at discharge. Thank you for the opportunity to be involved in the care of this patient. Flo Shanks RN, BSN, [place] [facility] [place], [facility] [phone] c

## 2015-10-28 NOTE — Progress Notes (Signed)
Patient has rested comfortably today. Does well in the mornings, feeding herself and alert, then sleeps some during the day and does not eat much as the day goes on. No complaints of pain. IV antibiotics continued. Repeat chest xray didn't show much change. Hospice has been in room and the plan will [facility] possibly tomorrow. Will continue to monitor.

## 2015-10-28 NOTE — Progress Notes (Signed)
PHARMACIST - PHYSICIAN COMMUNICATION CONCERNING: Antibiotic IV to Oral Route Change Policy  RECOMMENDATION: This patient is receiving azithromycin by the intravenous route.  Based on criteria approved by the Pharmacy and Therapeutics Committee, the antibiotic(s) is/are being converted to the equivalent oral dose form(s).   DESCRIPTION: These criteria include:  Patient being treated for a respiratory tract infection, urinary tract infection, cellulitis or clostridium difficile associated diarrhea if on metronidazole  The patient is not neutropenic and does not exhibit a GI malabsorption state  The patient is eating (either orally or via tube) and/or has been taking other orally administered medications for a least 24 hours  The patient is improving clinically and has a Tmax < 100.5  If you have questions about this conversion, please contact the Pharmacy Department  []  ( [phone] )  [place] [x]  ( [phone] )  [facility] []  ( [phone] )  [facility] []  ( [phone] )  [facility] []  ( [phone] )  [facility]  

## 2015-10-28 NOTE — Care Management Important Message (Signed)
Important Message  Patient Details  Name: [name] MRN: [id number] Date of Birth: 03-21-1915   Medicare Important Message Given:  Yes    Gianny Killman A, RN 10/28/2015, 8:12 AM

## 2015-10-28 NOTE — Progress Notes (Signed)
CSW contacted Clydie BraunKaren, [facility] liaison. Per Clydie BraunKaren she spoke to patient's family at beside. She reports that they are are interested in Hospice services. She reports that she needs an O2 order. She reports taht she will arrange for DME [facility](Hospital bed and O2). Per Clydie BraunKaren patient's family requested EMS transport back to [facility] at discharge. CSW will continue to follow and assist.   Woodroe Modehristina Halsey Persaud, MSW, LCSW-A Clinical Social Work Department [phone]

## 2015-10-28 NOTE — Progress Notes (Signed)
Notified Dr. Renae GlossWieting of patient's hypotension. MD stated he will discontinue losartan. No other orders at this time. Patient has CHF and may not tolerate fluids and is also getting some fluids with antibiotics. Will continue to monitor. Patient states she feels fine.

## 2015-10-29 ENCOUNTER — Telehealth: Payer: Self-pay | Admitting: *Deleted

## 2015-10-29 LAB — BASIC METABOLIC PANEL
Anion gap: 8 (ref 5–15)
BUN: 23 mg/dL — AB (ref 6–20)
CHLORIDE: 103 mmol/L (ref 101–111)
CO2: 30 mmol/L (ref 22–32)
CREATININE: 0.91 mg/dL (ref 0.44–1.00)
Calcium: 8.4 mg/dL — ABNORMAL LOW (ref 8.9–10.3)
GFR calc non Af Amer: 51 mL/min — ABNORMAL LOW (ref >60)
GFR, EST AFRICAN AMERICAN: 59 mL/min — AB (ref >60)
Glucose, Bld: 79 mg/dL (ref 65–99)
Potassium: 3.5 mmol/L (ref 3.5–5.1)
Sodium: 141 mmol/L (ref 135–145)

## 2015-10-29 MED ORDER — DILTIAZEM HCL ER COATED BEADS 180 MG PO CP24: 180.0000 mg | ORAL_CAPSULE | Freq: Every day | ORAL | Status: AC

## 2015-10-29 MED ORDER — AZITHROMYCIN 250 MG PO TABS: 250.0000 mg | ORAL_TABLET | Freq: Every day | ORAL | Status: AC

## 2015-10-29 NOTE — Progress Notes (Signed)
Report given to Maury Dus at [facility]. Pt is cleaned and ready to be transfer. Daughter at bedside. Telemetry d/c. IV d/c. EMS notified of need for transport

## 2015-10-29 NOTE — Telephone Encounter (Signed)
[name] please call patient

## 2015-10-29 NOTE — Telephone Encounter (Signed)
Patient is going under hospice care and is unwilling to come to office hospital follow up that patient is back at facility and unable to come to office. Hospice called for verbal order. Order given for care hospice will fax order to MD.

## 2015-10-29 NOTE — Discharge Summary (Signed)
[facility] [place]   PATIENT NAME: [name]    MR#:  [id number]  DATE OF BIRTH:  02/24/1915  DATE OF ADMISSION:  10/26/2015 ADMITTING PHYSICIAN: Katha Hamming, MD  DATE OF DISCHARGE: No discharge date for patient encounter.  PRIMARY CARE PHYSICIAN: Sherlene Shams, MD    ADMISSION DIAGNOSIS:  Altered mental status, unspecified altered mental status type [R41.82]   DISCHARGE DIAGNOSIS:  Acute on chronic diastolic congestive heart failure with increased pulmonary artery systolic pressures.  SECONDARY DIAGNOSIS:   Past Medical History  Diagnosis Date  . Incontinence of urine     bladder tac, collagen injections (Dr. Evelene Croon)  . Diverticulosis     with bleeding  . Atrial fibrillation (HCC)   . Arthritis     secondary to OA and DJD  . Hypertension   . GERD (gastroesophageal reflux disease)   . Heart murmur   . Hyperlipidemia   . CHF (congestive heart failure) (HCC)     EF 35-35% by y last ECHO  . Diverticulosis of colon     HOSPITAL COURSE:   1. Lethargy - improved. repeated chest x-ray could not rule out early infection. She has been treated with Rocephin and Zithromax. continue zithromax after discharge.  2. Acute on chronic diastolic congestive heart failure with increased pulmonary artery systolic pressures. Treated with iv lasix, Change Lasix to oral today.  3. Relative hypotension- DC'd losartan, decreased dose of Cardizem today. 4. Atrial fibrillation history- digoxin diltiazem for heart rate control. The junction level normal 5. Gastroesophageal reflux disease on Protonix 6. Family interested in hospice at facility.  DISCHARGE CONDITIONS:   Stable, discharge to group home with hospice.  CONSULTS OBTAINED:     DRUG ALLERGIES:   Allergies  Allergen Reactions  . Aspirin Other (See Comments)    Unknown reaction  . Sulfa Antibiotics Hives and Rash    DISCHARGE MEDICATIONS:   Current Discharge  Medication List    START taking these medications   Details  azithromycin (ZITHROMAX) 250 MG tablet Take 1 tablet (250 mg total) by mouth daily. Qty: 5 each, Refills: 0      CONTINUE these medications which have CHANGED   Details  diltiazem (CARDIZEM CD) 180 MG 24 hr capsule Take 1 capsule (180 mg total) by mouth daily. Qty: 30 capsule, Refills: 0      CONTINUE these medications which have NOT CHANGED   Details  acetaminophen (TYLENOL) 500 MG tablet Take 1,000 mg by mouth daily after breakfast.    Calcium Carbonate-Vitamin D 600-400 MG-UNIT per tablet TAKE 1 TABLET BY MOUTH 2 TIMES A DAY WITH MEALS. Qty: 60 tablet, Refills: 6    digoxin (LANOXIN) 0.125 MG tablet TAKE 1/2 TABLET BY MOUTH EACH MORNING FOR HEART Qty: 15 tablet, Refills: 5    escitalopram (LEXAPRO) 10 MG tablet TAKE 1 TABLET BY MOUTH ONCE DAILY FOR DEPRESSION. Qty: 30 tablet, Refills: 11    furosemide (LASIX) 20 MG tablet TAKE 1 TABLET BY MOUTH ONCE DAILY FOR EDEMA Qty: 90 tablet, Refills: 3    megestrol (MEGACE) 400 MG/10ML suspension Take 10 mLs (400 mg total) by mouth daily. Qty: 240 mL, Refills: 0    mirtazapine (REMERON) 45 MG tablet Take 1 tablet (45 mg total) by mouth at bedtime. Qty: 90 tablet, Refills: 1    omeprazole (PRILOSEC) 40 MG capsule TAKE ONE CAPSULE BY MOUTH ONCE DAILY FOR GERD Qty: 30 capsule, Refills: 2      STOP taking these medications  losartan (COZAAR) 25 MG tablet      meloxicam (MOBIC) 7.5 MG tablet          DISCHARGE INSTRUCTIONS:    If you experience worsening of your admission symptoms, develop shortness of breath, life threatening emergency, suicidal or homicidal thoughts you must seek medical attention immediately by calling 911 or calling your MD immediately  if symptoms less severe.  You Must read complete instructions/literature along with all the possible adverse reactions/side effects for all the Medicines you take and that have been prescribed to you. Take  any new Medicines after you have completely understood and accept all the possible adverse reactions/side effects.   Please note  You were cared for by a hospitalist during your hospital stay. If you have any questions about your discharge medications or the care you received while you were in the hospital after you are discharged, you can call the unit and asked to speak with the hospitalist on call if the hospitalist that took care of you is not available. Once you are discharged, your primary care physician will handle any further medical issues. Please note that NO REFILLS for any discharge medications will be authorized once you are discharged, as it is imperative that you return to your primary care physician (or establish a relationship with a primary care physician if you do not have one) for your aftercare needs so that they can reassess your need for medications and monitor your lab values.    Today   SUBJECTIVE   Demented. No complaint.    VITAL SIGNS:  Blood pressure 119/48, pulse 79, temperature 98.2 F (36.8 C), temperature source Oral, resp. rate 16, height 5' (1.524 m), weight 51.619 kg (113 lb 12.8 oz), SpO2 96 %.  I/O:   Intake/Output Summary (Last 24 hours) at 10/29/15 0919 Last data filed at 10/28/15 1354  Gross per 24 hour  Intake    420 ml  Output      0 ml  Net    420 ml    PHYSICAL EXAMINATION:  GENERAL:  82 y.o.-year-old patient lying in the bed with no acute distress.  EYES: Pupils equal, round, reactive to light and accommodation. No scleral icterus. Extraocular muscles intact.  HEENT: Head atraumatic, normocephalic. Oropharynx and nasopharynx clear.  NECK:  Supple, no jugular venous distention. No thyroid enlargement, no tenderness.  LUNGS: Normal breath sounds bilaterally, no wheezing, rales,rhonchi or crepitation. No use of accessory muscles of respiration.  CARDIOVASCULAR: S1, S2 normal. No murmurs, rubs, or gallops.  ABDOMEN: Soft, non-tender,  non-distended. Bowel sounds present. No organomegaly or mass.  EXTREMITIES: No pedal edema, cyanosis, or clubbing.  NEUROLOGIC: Cranial nerves II through XII are intact. Muscle strength 3/5 in all extremities. Sensation intact. Gait not checked.  PSYCHIATRIC: The patient is demented. SKIN: No obvious rash, lesion, or ulcer.   DATA REVIEW:   CBC  Recent Labs Lab 10/27/15 0525  WBC 8.3  HGB 13.6  HCT 42.1  PLT 209    Chemistries   Recent Labs Lab 10/26/15 2047  10/29/15 0549  NA  --   < > 141  K  --   < > 3.5  CL  --   < > 103  CO2  --   < > 30  GLUCOSE  --   < > 79  BUN  --   < > 23*  CREATININE  --   < > 0.91  CALCIUM  --   < > 8.4*  MG 1.8  --   --   < > =  values in this interval not displayed.  Cardiac Enzymes  Recent Labs Lab 10/26/15 1047  TROPONINI 0.06*    Microbiology Results  Results for orders placed or performed during the hospital encounter of 10/26/15  Culture, blood (routine x 2)     Status: None (Preliminary result)   Collection Time: 10/26/15 10:47 AM  Result Value Ref Range Status   Specimen Description BLOOD LEFT ARM  Final   Special Requests BOTTLES DRAWN AEROBIC AND ANAEROBIC  2CC  Final   Culture NO GROWTH 3 DAYS  Final   Report Status PENDING  Incomplete  Culture, blood (routine x 2)     Status: None (Preliminary result)   Collection Time: 10/26/15 10:55 AM  Result Value Ref Range Status   Specimen Description BLOOD RIGHT ANTECUBITAL  Final   Special Requests   Final    BOTTLES DRAWN AEROBIC AND ANAEROBIC  AER 5CC ANA 1CC   Culture NO GROWTH 3 DAYS  Final   Report Status PENDING  Incomplete  Urine culture     Status: None   Collection Time: 10/26/15 12:36 PM  Result Value Ref Range Status   Specimen Description URINE, RANDOM  Final   Special Requests NONE  Final   Culture NO GROWTH 2 DAYS  Final   Report Status 10/28/2015 FINAL  Final    RADIOLOGY:  Dg Chest 1 View  10/28/2015  CLINICAL DATA:  Shortness of breath,  hypertension, dementia EXAM: CHEST 1 VIEW COMPARISON:  10/26/2015 FINDINGS: Cardiomegaly again noted. Mild suprahilar interstitial prominence probable residual mild interstitial edema. Small bilateral pleural effusion with bilateral basilar atelectasis or infiltrate. IMPRESSION: Cardiomegaly. Mild suprahilar interstitial prominence probable residual mild interstitial edema. Small bilateral pleural effusion with bilateral basilar atelectasis or infiltrate. Electronically Signed   By: Natasha Mead M.D.   On: 10/28/2015 11:21        Management plans discussed with the patient, family and they are in agreement.  CODE STATUS:     Code Status Orders        Start     Ordered   10/27/15 1224  Do not attempt resuscitation (DNR)   Continuous    Question Answer Comment  In the event of cardiac or respiratory ARREST Do not call a "code blue"   In the event of cardiac or respiratory ARREST Do not perform Intubation, CPR, defibrillation or ACLS   In the event of cardiac or respiratory ARREST Use medication by any route, position, wound care, and other measures to relive pain and suffering. May use oxygen, suction and manual treatment of airway obstruction as needed for comfort.   Comments nurse may pronounce      10/27/15 1223    Code Status History    Date Active Date Inactive Code Status Order ID Comments User Context   10/26/2015  7:53 PM 10/27/2015 12:23 PM Full Code [id number]  Emelda Brothers, RN Inpatient   10/26/2015  2:05 PM 10/26/2015  7:53 PM DNR [id number]  Katha Hamming, MD ED   10/08/2015  9:08 AM 10/08/2015  4:02 PM Full Code [id number]  Enid Baas, MD Inpatient   10/04/2015 12:26 PM 10/08/2015  9:08 AM DNR [id number]  Ramonita Lab, MD Inpatient      TOTAL TIME TAKING CARE OF THIS PATIENT: 37 minutes.    Shaune Pollack M.D on 10/29/2015 at 9:19 AM  Between 7am to 6pm - Pager - [phone]  After 6pm go to [email or web address] - password Forensic psychologist Hospitalists   Office  [phone]  CC: Primary care physician; Sherlene Shams, MD

## 2015-10-29 NOTE — NC FL2 (Signed)
[place] MEDICAID FL2 LEVEL OF CARE SCREENING TOOL     IDENTIFICATION  Patient Name: [name] Birthdate: 1914-12-10 Sex: female Admission Date (Current Location): 10/26/2015  [facility] and [place] Number:  Chiropodist and Address:  [facility], [address], [place], [place] [address]      Provider Number: [id number]  Attending Physician Name and Address:  Shaune Pollack, MD  Relative Name and Phone Number:       Current Level of Care: Hospital Recommended Level of Care: Family Care Home Prior Approval Number:    Date Approved/Denied:   PASRR Number:    Discharge Plan: Other (Comment) (Family Care Home )    Current Diagnoses: Patient Active Problem List   Diagnosis Date Noted  . CHF (congestive heart failure) (HCC) 10/26/2015  . Acute cystitis 10/08/2015  . OA (osteoarthritis) of knee 09/11/2014  . Bilateral hearing loss due to cerumen impaction 06/23/2014  . Major depression, melancholic type (HCC) 01/01/2014  . Failure to thrive in adult 12/03/2013  . Chronic right hip pain 05/05/2013  . Adnexal mass 05/05/2013  . Vitamin D deficiency 02/11/2013  . Constipation 04/09/2012  . Chronic atrial fibrillation (HCC) 07/26/2011  . Congestive heart failure with left ventricular systolic dysfunction (HCC) 07/26/2011  . Hypertension   . GERD (gastroesophageal reflux disease)   . Heart murmur   . Hyperlipidemia   . Diverticulosis of colon     Orientation RESPIRATION BLADDER Height & Weight    Self  Normal Continent 5' (152.4 cm) 113 lbs.  BEHAVIORAL SYMPTOMS/MOOD NEUROLOGICAL BOWEL NUTRITION STATUS   (None )  (None ) Continent Diet (2 gram sodium )  AMBULATORY STATUS COMMUNICATION OF NEEDS Skin   Extensive Assist Verbally Normal                       Personal Care Assistance Level of Assistance  Bathing, Feeding, Dressing Bathing Assistance: Limited assistance Feeding assistance: Independent Dressing Assistance:  Limited assistance     Functional Limitations Info  Sight, Hearing, Speech Sight Info: Adequate Hearing Info: Impaired (Patient has impaired hearing in both ears. ) Speech Info: Adequate    SPECIAL CARE FACTORS FREQUENCY  PT (By licensed PT)                    Contractures Contractures Info: Not present    Additional Factors Info  Allergies, Code Status Code Status Info:  (DNR) Allergies Info:  (Aspirin, Sulfa Antibiotics)           Current Medications (10/29/2015):  This is the current hospital active medication list Current Facility-Administered Medications  Medication Dose Route Frequency Provider Last Rate Last Dose  . acetaminophen (TYLENOL) tablet 1,000 mg  1,000 mg Oral QPC breakfast Katha Hamming, MD   1,000 mg at 10/29/15 0806  . azithromycin (ZITHROMAX) tablet 250 mg  250 mg Oral Daily Bertram Savin, RPH   250 mg at 10/29/15 0809  . cefTRIAXone (ROCEPHIN) 1 g in dextrose 5 % 50 mL IVPB  1 g Intravenous Q24H Alford Highland, MD   1 g at 10/28/15 1216  . digoxin (LANOXIN) tablet 0.0625 mg  0.0625 mg Oral Daily Altamese Dilling, MD   0.0625 mg at 10/29/15 0807  . diltiazem (CARDIZEM CD) 24 hr capsule 180 mg  180 mg Oral Daily Alford Highland, MD   180 mg at 10/29/15 0807  . enoxaparin (LOVENOX) injection 30 mg  30 mg Subcutaneous Q24H Sherry Ruffing, [facility]  30 mg at 10/28/15 2152  . escitalopram (LEXAPRO) tablet 10 mg  10 mg Oral Daily Katha Hamming, MD   10 mg at 10/29/15 0806  . furosemide (LASIX) tablet 20 mg  20 mg Oral Daily Alford Highland, MD   20 mg at 10/29/15 1610  . megestrol (MEGACE) 400 MG/10ML suspension 400 mg  400 mg Oral Daily Katha Hamming, MD   400 mg at 10/29/15 0806  . meloxicam (MOBIC) tablet 7.5 mg  7.5 mg Oral Daily Katha Hamming, MD   7.5 mg at 10/29/15 0806  . mirtazapine (REMERON) tablet 45 mg  45 mg Oral QHS Katha Hamming, MD   45 mg at 10/28/15 2152  . pantoprazole (PROTONIX) EC tablet 40 mg  40  mg Oral Daily Cindi Carbon, RPH   40 mg at 10/29/15 9604     Discharge Medications: Please see discharge summary for a list of discharge medications.  Relevant Imaging Results:  Relevant Lab Results:   Additional Information  (SSN [id number])  Verta Ellen Sunkins, LCSW

## 2015-10-29 NOTE — Telephone Encounter (Signed)
Pt will not be able to come in for a follow up after hospital visit, refused appointment. [name] stated that she would like to speak with Dr. Darrick Huntsman Please advise./tvw

## 2015-10-29 NOTE — Discharge Instructions (Signed)
Low sodium diet. Activity as tolerated. Hospice care.

## 2015-10-29 NOTE — Progress Notes (Signed)
Follow up visit made to new referral for [facility] at [facility] care home. Writer verified needed DME had been delivered. CSW and CMRN informed that equipment is in place. Patient seen sitting up in bed, alert and quietly interactive. Son in law at bedside, informed him that equipment is in place. Plan is for discharge today. CSW Trula Ore aware that patient will need EMS transport. Signed DNR in place on patient's chart. Discharge information to be faxed to referral when available. Thank you. Dayna Barker RN, BSN, [facility] [place], [facility] [phone] c

## 2015-10-29 NOTE — Progress Notes (Signed)
Clinical Social Worker was informed that patient will be medically ready to discharge to [facility]. Spoke to patient's daughter, Para March at bedside she is in a agreement with plan. CSW called Estell Harpin, Rella Larve [facility] to confirm that patient's bed is ready. Call report to [facility]; [phone] (Cell) or [phone]. All discharge information faxed to [facility] via [facility]. Rx's and DNR added to discharge packet. RN will call report and patient will discharge to [facility] via [facility].   Woodroe Mode, MSW, LCSW-A Clinical Social Work Department [phone]

## 2015-10-29 NOTE — Telephone Encounter (Signed)
Patient will discharge from [facility] 10/29/15 with a diagnosis of congestive heart failure. She will need a place on Dr. Melina Schools schedule to be seen.

## 2015-10-30 ENCOUNTER — Telehealth: Payer: Self-pay | Admitting: Internal Medicine

## 2015-10-30 NOTE — Telephone Encounter (Signed)
I have to have a face to face to order a home health eval,  And patient is too weak to come in .  She is not ambulatory.  I thought [facility] would see her

## 2015-10-31 LAB — CULTURE, BLOOD (ROUTINE X 2)
CULTURE: NO GROWTH
Culture: NO GROWTH

## 2015-11-04 ENCOUNTER — Ambulatory Visit: Payer: Commercial Managed Care - HMO | Admitting: Internal Medicine

## 2015-11-05 ENCOUNTER — Telehealth: Payer: Self-pay | Admitting: Internal Medicine

## 2015-11-05 NOTE — Telephone Encounter (Signed)
Please advise 

## 2015-11-05 NOTE — Telephone Encounter (Signed)
Form faxed to hospice first time 11/01/15 day received from MD faxed again today to hospice.

## 2015-11-05 NOTE — Telephone Encounter (Signed)
[name] [phone] called from Hospice regarding not receiving the skin care protocol and comfort kit which was faxed 10/30/2015, she did receive the pain and symptom management protocol. Fax number [phone]. Thank you!

## 2015-11-14 ENCOUNTER — Telehealth: Payer: Self-pay | Admitting: Internal Medicine

## 2015-11-14 NOTE — Telephone Encounter (Signed)
Vickey [phone] called from Hospice regarding pt passing away 12/05/2015 :55am. Daughter Gypsy Decant @ [facility]. Thank you!

## 2015-11-14 NOTE — Telephone Encounter (Signed)
Please send a sympathy card to [name]' daughter

## 2015-11-15 ENCOUNTER — Telehealth: Payer: Self-pay | Admitting: Internal Medicine

## 2015-11-15 NOTE — Telephone Encounter (Signed)
Mr. [name] with [facility] came in and dropped off death certificate to be signed.. Please advise [name] @ [phone] when ready.Sherrie Sport to [place].Marland Kitchen

## 2015-11-15 NOTE — Telephone Encounter (Signed)
See previous note copied scan to chart and called facility for pick up.

## 2015-11-15 NOTE — Telephone Encounter (Signed)
Death certificate signed and returned to [facility]

## 2015-11-15 NOTE — Telephone Encounter (Signed)
Made copy of death certificate fopr chart placed at front desk and notified funeral home ready for pick up.

## 2015-12-11 DEATH — deceased

## 2016-02-04 IMAGING — CT CT HEAD W/O CM
2 series · 16 of 30 positions shown, 20 images · non-contrast
Comparison: Head CT 02/11/2014

CLINICAL DATA: Altered mental status. Not acting like himself.
[AGE] female

EXAM:
CT HEAD WITHOUT CONTRAST
TECHNIQUE: Contiguous axial images were obtained from the base of the skull
through the vertex without intravenous contrast.

[Series 2: soft tissue · axial · 0.41mm/px · z∈[+1175,+1295]mm · 13 of 29 slices shown, 17 images]
[im 3/29  brain]
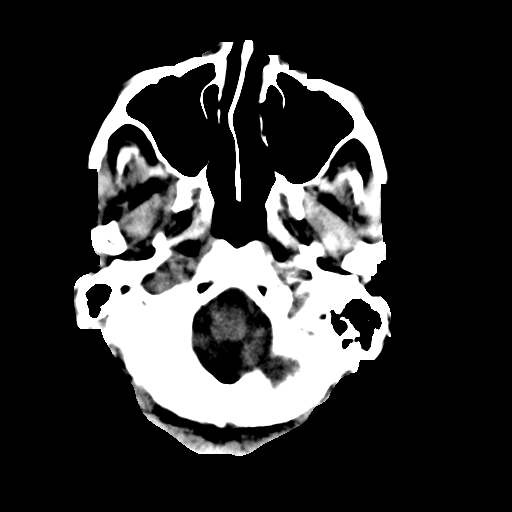
[im 3/29  bone]
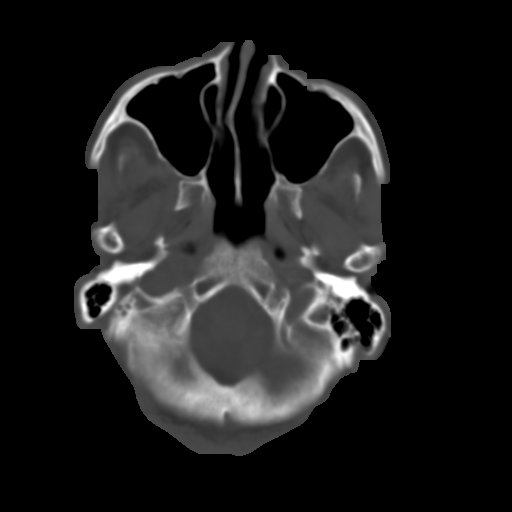
[im 5/29  brain]
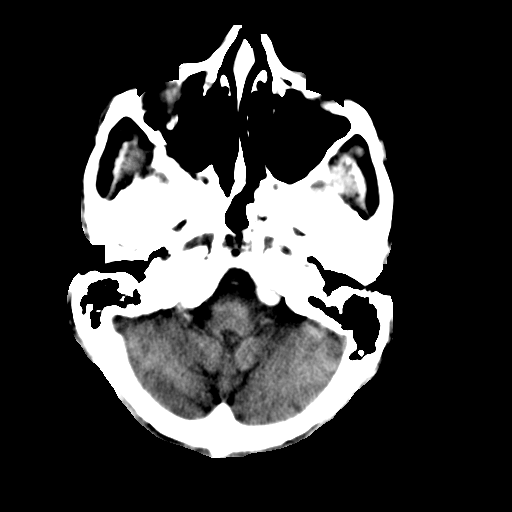
[im 7/29  brain]
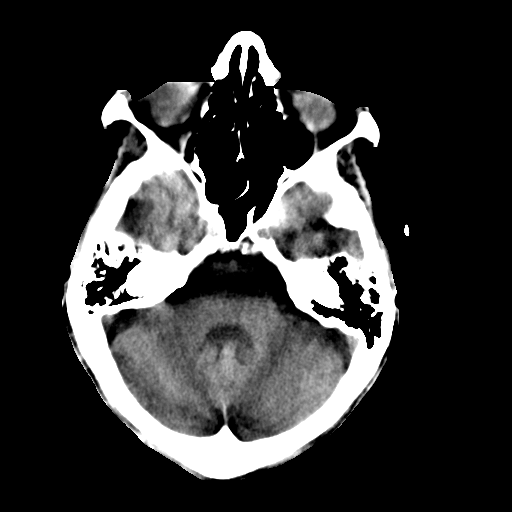
[im 9/29  brain]
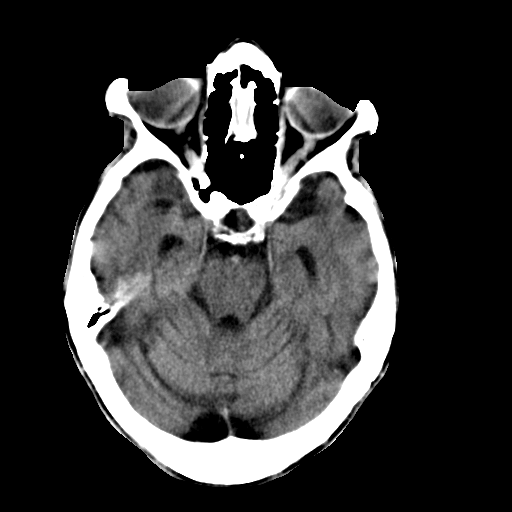
[im 11/29  brain]
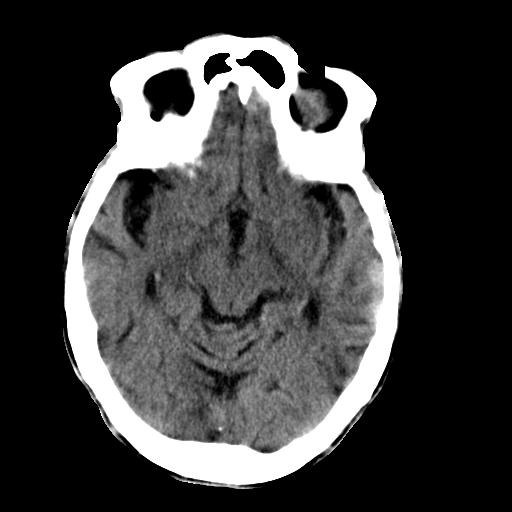
[im 11/29  bone]
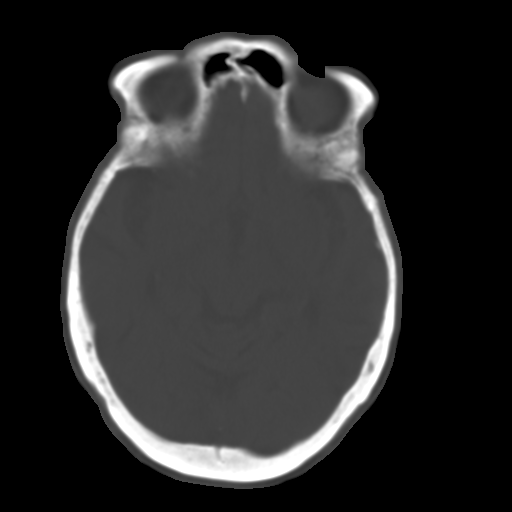
[im 13/29  brain]
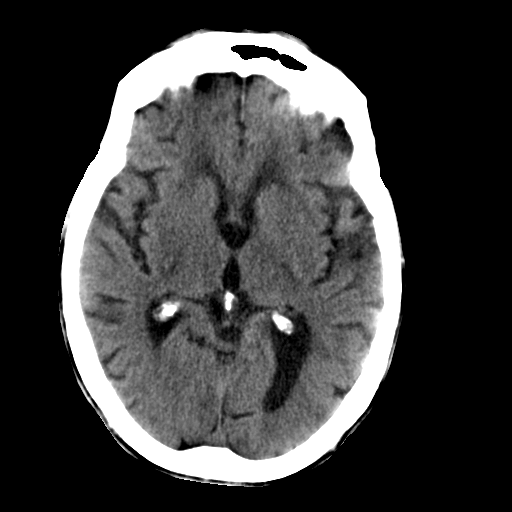
[im 15/29  brain]
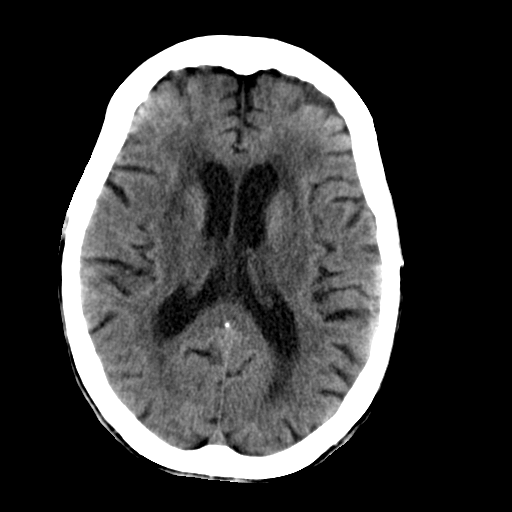
[im 17/29  brain]
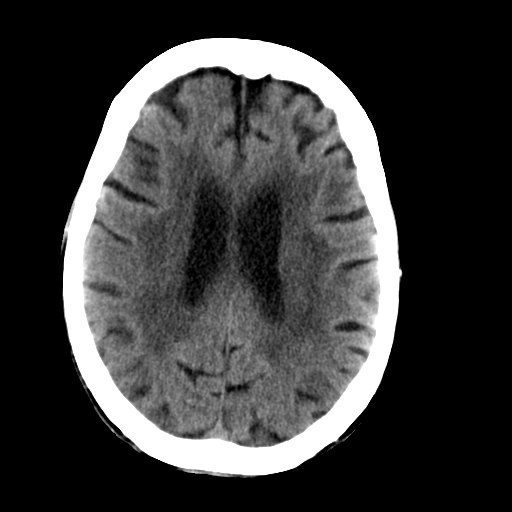
[im 19/29  brain]
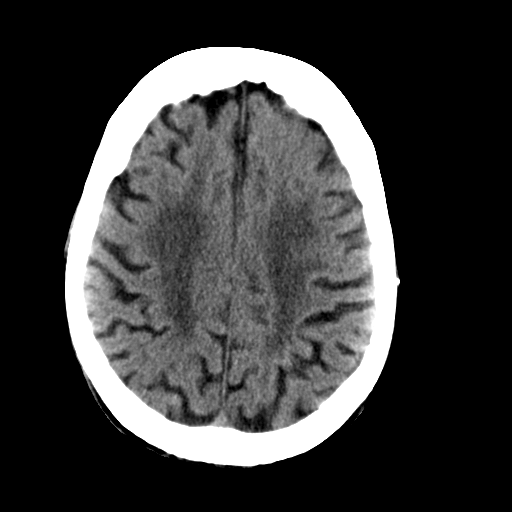
[im 19/29  bone]
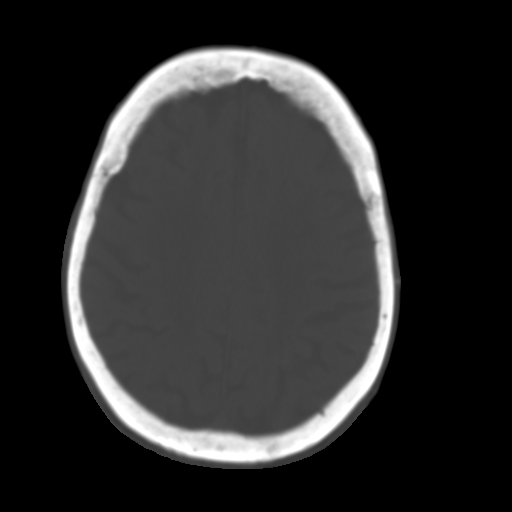
[im 21/29  brain]
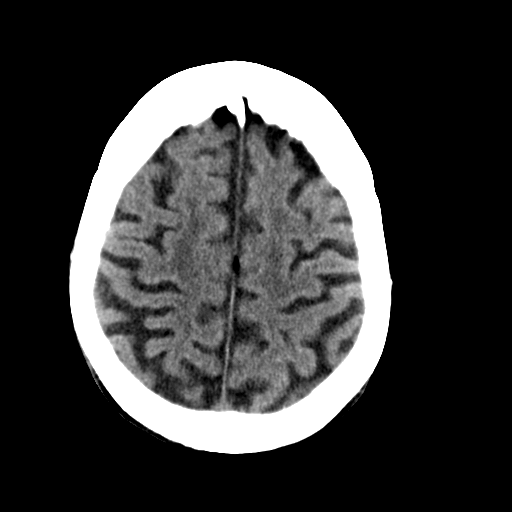
[im 23/29  brain]
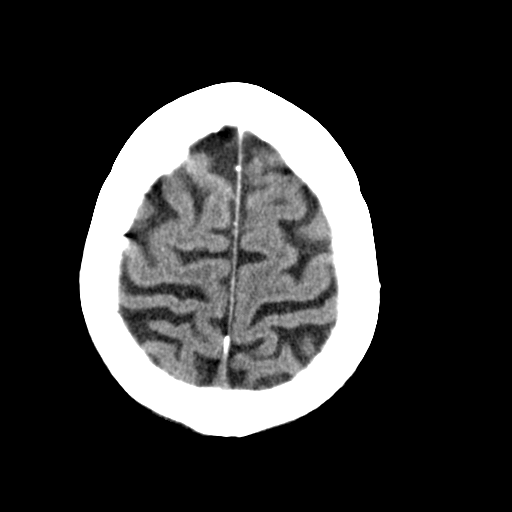
[im 25/29  brain]
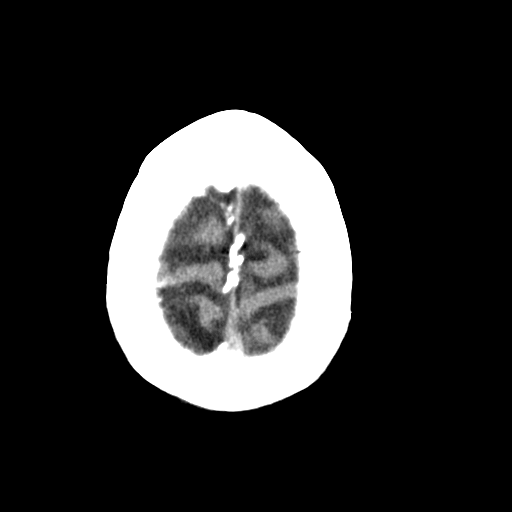
[im 27/29  brain]
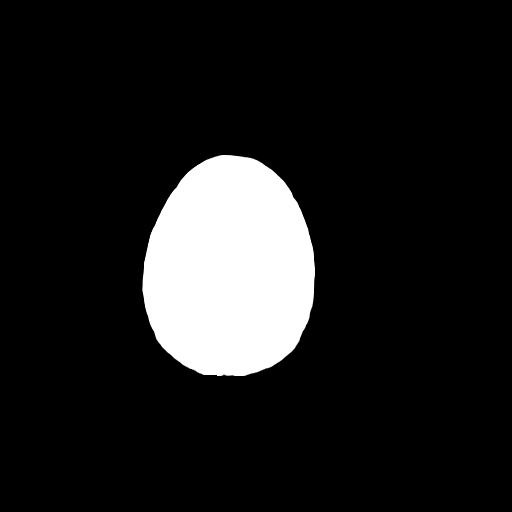
[im 27/29  bone]
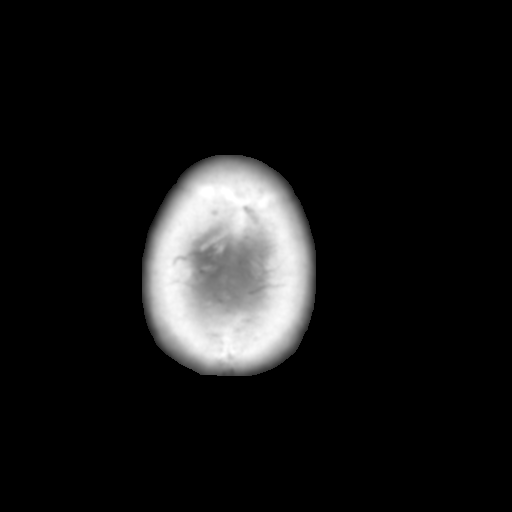

[Series 4: soft tissue recon · axial · 0.41mm/px · z∈[+1204,+1243]mm · 3 of 30 slices shown]
[im 3/30  brain]
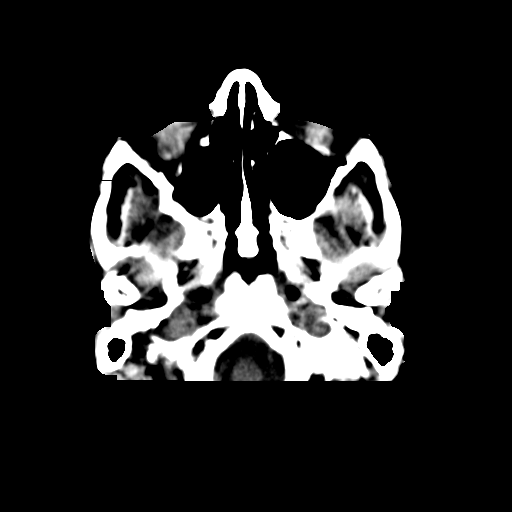
[im 7/30  brain]
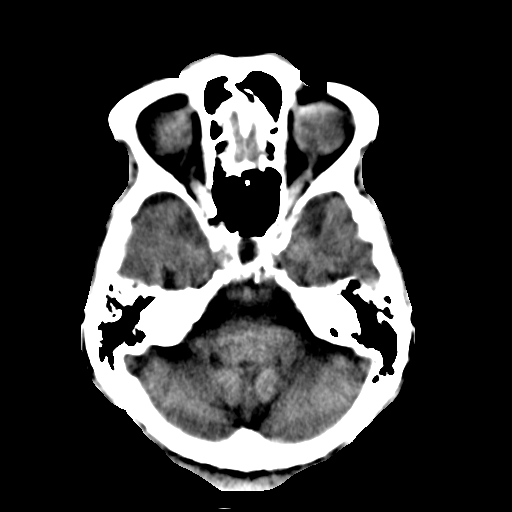
[im 11/30  brain]
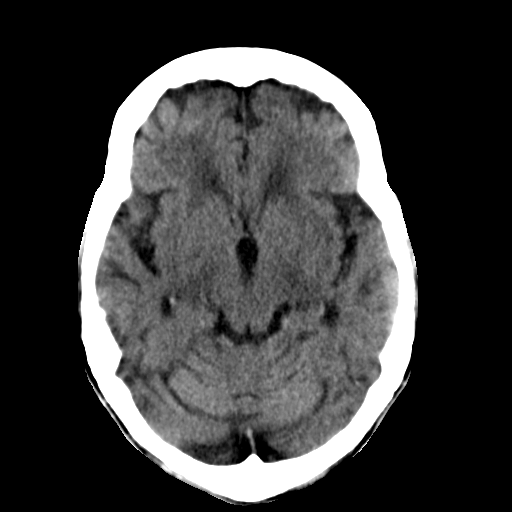

[16 of 30 positions shown; findings below may reference images not displayed]

FINDINGS: No acute intracranial hemorrhage. No focal mass lesion. No CT
evidence of acute infarction. No midline shift or mass effect. No
hydrocephalus. Basilar cisterns are patent.

There is generalized cortical atrophy and periventricular white
matter hypodensities not changed from prior.

Paranasal sinuses and  mastoid air cells are clear.
IMPRESSION: 1. No acute intracranial findings.  No change from prior.
2. Chronic atrophy and white matter microvascular disease.
